# Patient Record
Sex: Male | Born: 1975 | Hispanic: Yes | Marital: Single | Smoking: Current every day smoker
Health system: Southern US, Community
[De-identification: ages and names within clinical notes are randomized; demographics above are authoritative.]

## PROBLEM LIST (undated history)

## (undated) DIAGNOSIS — R42 Dizziness and giddiness: Secondary | ICD-10-CM

## (undated) DIAGNOSIS — I10 Essential (primary) hypertension: Secondary | ICD-10-CM

---

## 2004-08-31 ENCOUNTER — Emergency Department: Payer: Self-pay | Admitting: Emergency Medicine

## 2019-07-29 ENCOUNTER — Emergency Department
Admission: EM | Admit: 2019-07-29 | Discharge: 2019-07-29 | Disposition: A | Discharge status: Home / Self Care | Payer: Self-pay | Attending: Emergency Medicine | Admitting: Emergency Medicine

## 2019-07-29 ENCOUNTER — Encounter: Payer: Self-pay | Admitting: Medical Oncology

## 2019-07-29 ENCOUNTER — Other Ambulatory Visit: Payer: Self-pay

## 2019-07-29 DIAGNOSIS — R42 Dizziness and giddiness: Secondary | ICD-10-CM | POA: Insufficient documentation

## 2019-07-29 LAB — CBC
HCT: 48.5 % (ref 39.0–52.0)
Hemoglobin: 15.4 g/dL (ref 13.0–17.0)
MCH: 26.9 pg (ref 26.0–34.0)
MCHC: 31.8 g/dL (ref 30.0–36.0)
MCV: 84.6 fL (ref 80.0–100.0)
Platelets: 301 10*3/uL (ref 150–400)
RBC: 5.73 MIL/uL (ref 4.22–5.81)
RDW: 14.5 % (ref 11.5–15.5)
WBC: 9.5 10*3/uL (ref 4.0–10.5)
nRBC: 0 % (ref 0.0–0.2)

## 2019-07-29 LAB — BASIC METABOLIC PANEL
Anion gap: 7 (ref 5–15)
BUN: 16 mg/dL (ref 6–20)
CO2: 24 mmol/L (ref 22–32)
Calcium: 8.6 mg/dL — ABNORMAL LOW (ref 8.9–10.3)
Chloride: 105 mmol/L (ref 98–111)
Creatinine, Ser: 1.09 mg/dL (ref 0.61–1.24)
GFR calc Af Amer: >60 mL/min (ref >60)
GFR calc non Af Amer: >60 mL/min (ref >60)
Glucose, Bld: 103 mg/dL — ABNORMAL HIGH (ref 70–99)
Potassium: 4 mmol/L (ref 3.5–5.1)
Sodium: 136 mmol/L (ref 135–145)

## 2019-07-29 MED ORDER — ONDANSETRON HCL 4 MG PO TABS: 4.0000 mg | ORAL_TABLET | Freq: Every day | ORAL | 1 refills | Status: DC | PRN

## 2019-07-29 MED ORDER — MECLIZINE HCL 25 MG PO TABS: 25.0000 mg | ORAL_TABLET | Freq: Three times a day (TID) | ORAL | 0 refills | Status: DC | PRN

## 2019-07-29 NOTE — ED Provider Notes (Signed)
[facility] Emergency Department Provider Note   ____________________________________________    I have reviewed the triage vital signs and the nursing notes.   HISTORY  Chief Complaint Dizziness     HPI Danny Scott is a 44 y.o. male who presents with complaints of dizziness.  Describes sensation of the room moving x2 days.  Seem to be getting better however this morning it returned.  Denies neuro deficits or headache.  Has never had this before.  No recent injuries.  No nausea vomiting.  Has not take anything for this.  Reports this is affecting his ability to work.  No history of vertigo in the past.  No tinnitus.  History reviewed. No pertinent past medical history.  There are no problems to display for this patient.   History reviewed. No pertinent surgical history.  Prior to Admission medications   Medication Sig Start Date End Date Taking? Authorizing Provider  meclizine (ANTIVERT) 25 MG tablet Take 1 tablet (25 mg total) by mouth 3 (three) times daily as needed for dizziness. 07/29/19   Jene Every, MD  ondansetron (ZOFRAN) 4 MG tablet Take 1 tablet (4 mg total) by mouth daily as needed for nausea or vomiting. 07/29/19   Jene Every, MD     Allergies Patient has no known allergies.  No family history on file.  Social History Social History   Tobacco Use  . Smoking status: Not on file  Substance Use Topics  . Alcohol use: Not on file  . Drug use: Not on file    Review of Systems  Constitutional: No fever/chills Eyes: No visual changes.  ENT: As above Cardiovascular: Denies chest pain. Respiratory: Denies shortness of breath. Gastrointestinal: No abdominal pain.  No nausea, no vomiting.   Genitourinary: Negative for dysuria. Musculoskeletal: Negative for back pain. Skin: Negative for rash. Neurological: As above   ____________________________________________   PHYSICAL EXAM:  VITAL SIGNS: ED Triage  Vitals  Enc Vitals Group     BP 07/29/19 1042 (!) 175/102     Pulse Rate 07/29/19 1042 63     Resp 07/29/19 1042 16     Temp 07/29/19 1042 98.5 F (36.9 C)     Temp Source 07/29/19 1042 Oral     SpO2 07/29/19 1042 98 %     Weight 07/29/19 1040 90.7 kg (200 lb)     Height 07/29/19 1040 1.651 m (5\' 5" )     Head Circumference --      Peak Flow --      Pain Score 07/29/19 1040 0     Pain Loc --      Pain Edu? --      Excl. in GC? --     Constitutional: Alert and oriented. No acute distress. Eyes: Conjunctivae are normal.  Left gaze nystagmus Head: Atraumatic. Ears: Normal TMs Mouth/Throat: Mucous membranes are moist.   Neck:  Painless ROM Cardiovascular: Normal rate, regular rhythm. Grossly normal heart sounds.   Respiratory: Normal respiratory effort.  No retractions. Lungs CTAB. Gastrointestinal: Soft and nontender. No distention.   Musculoskeletal: .  Warm and well perfused Neurologic:  Normal speech and language. No gross focal neurologic deficits are appreciated.  Cranial nerves II through XII are normal Skin:  Skin is warm, dry and intact. No rash noted. Psychiatric: Mood and affect are normal. Speech and behavior are normal.  ____________________________________________   LABS (all labs ordered are listed, but only abnormal results are displayed)  Labs Reviewed  BASIC METABOLIC  PANEL - Abnormal; Notable for the following components:      Result Value   Glucose, Bld 103 (*)    Calcium 8.6 (*)    All other components within normal limits  CBC   ____________________________________________  EKG  ED ECG REPORT I, Lavonia Drafts, the attending physician, personally viewed and interpreted this ECG.  Date: 07/29/2019  Rhythm: normal sinus rhythm QRS Axis: normal Intervals: normal ST/T Wave abnormalities: normal Narrative Interpretation: no evidence of acute  ischemia  ____________________________________________  RADIOLOGY  None ____________________________________________   PROCEDURES  Procedure(s) performed: No  Procedures   Critical Care performed: No ____________________________________________   INITIAL IMPRESSION / ASSESSMENT AND PLAN / ED COURSE  Pertinent labs & imaging results that were available during my care of the patient were reviewed by me and considered in my medical decision making (see chart for details).  Patient presents with vertiginous symptoms x2 days.  Normal neuro exam.  Differential includes BPV, vestibular/labyrinthitis/Mnire's.  Given age, lack of neurodeficits lack of tenderness most consistent with BPV.  Will treat with meclizine, Zofran, outpatient follow-up with ENT if no improvement.    ____________________________________________   FINAL CLINICAL IMPRESSION(S) / ED DIAGNOSES  Final diagnoses:  Dizziness  Vertigo        Note:  This document was prepared using Dragon voice recognition software and may include unintentional dictation errors.   Lavonia Drafts, MD 07/29/19 [phone]

## 2019-07-29 NOTE — ED Notes (Signed)
Pt verbalizes understanding of d/c instructions, medications and follow up. Also explained to pt significant other per pt request.

## 2019-07-29 NOTE — ED Triage Notes (Signed)
Pt reports dizziness x 2 days- reports dizziness more when he turns his head. Denies nausea/vomiting. Reports poor hydration and works in the heat.

## 2021-08-02 ENCOUNTER — Inpatient Hospital Stay
Admit: 2021-08-02 | Discharge: 2021-08-02 | Disposition: A | Discharge status: Home / Self Care | Payer: Self-pay | Attending: Cardiology | Admitting: Cardiology

## 2021-08-02 ENCOUNTER — Other Ambulatory Visit: Payer: Self-pay

## 2021-08-02 ENCOUNTER — Inpatient Hospital Stay
Admission: EM | Admit: 2021-08-02 | Discharge: 2021-08-04 | DRG: 282 | Disposition: A | Discharge status: Home / Self Care | Payer: Self-pay | Attending: Internal Medicine | Admitting: Internal Medicine

## 2021-08-02 ENCOUNTER — Emergency Department: Payer: Self-pay

## 2021-08-02 ENCOUNTER — Encounter: Payer: Self-pay | Admitting: Emergency Medicine

## 2021-08-02 DIAGNOSIS — I10 Essential (primary) hypertension: Secondary | ICD-10-CM | POA: Diagnosis present

## 2021-08-02 DIAGNOSIS — I4510 Unspecified right bundle-branch block: Secondary | ICD-10-CM | POA: Diagnosis present

## 2021-08-02 DIAGNOSIS — Z79899 Other long term (current) drug therapy: Secondary | ICD-10-CM

## 2021-08-02 DIAGNOSIS — I214 Non-ST elevation (NSTEMI) myocardial infarction: Principal | ICD-10-CM | POA: Diagnosis present

## 2021-08-02 DIAGNOSIS — Z6836 Body mass index (BMI) 36.0-36.9, adult: Secondary | ICD-10-CM

## 2021-08-02 DIAGNOSIS — E669 Obesity, unspecified: Secondary | ICD-10-CM | POA: Diagnosis present

## 2021-08-02 DIAGNOSIS — R739 Hyperglycemia, unspecified: Secondary | ICD-10-CM | POA: Diagnosis present

## 2021-08-02 DIAGNOSIS — F17210 Nicotine dependence, cigarettes, uncomplicated: Secondary | ICD-10-CM | POA: Diagnosis present

## 2021-08-02 DIAGNOSIS — I2510 Atherosclerotic heart disease of native coronary artery without angina pectoris: Secondary | ICD-10-CM | POA: Diagnosis present

## 2021-08-02 DIAGNOSIS — E785 Hyperlipidemia, unspecified: Secondary | ICD-10-CM | POA: Diagnosis present

## 2021-08-02 HISTORY — DX: Dizziness and giddiness: R42

## 2021-08-02 HISTORY — DX: Essential (primary) hypertension: I10

## 2021-08-02 LAB — HEPARIN LEVEL (UNFRACTIONATED)
Heparin Unfractionated: 0.1 IU/mL — ABNORMAL LOW (ref 0.30–0.70)
Heparin Unfractionated: 0.25 IU/mL — ABNORMAL LOW (ref 0.30–0.70)

## 2021-08-02 LAB — APTT: aPTT: 29 seconds (ref 24–36)

## 2021-08-02 LAB — BASIC METABOLIC PANEL
Anion gap: 8 (ref 5–15)
BUN: 18 mg/dL (ref 6–20)
CO2: 22 mmol/L (ref 22–32)
Calcium: 9.1 mg/dL (ref 8.9–10.3)
Chloride: 107 mmol/L (ref 98–111)
Creatinine, Ser: 1.02 mg/dL (ref 0.61–1.24)
GFR, Estimated: >60 mL/min (ref >60)
Glucose, Bld: 137 mg/dL — ABNORMAL HIGH (ref 70–99)
Potassium: 4.1 mmol/L (ref 3.5–5.1)
Sodium: 137 mmol/L (ref 135–145)

## 2021-08-02 LAB — CBC
HCT: 49 % (ref 39.0–52.0)
Hemoglobin: 15.8 g/dL (ref 13.0–17.0)
MCH: 26.6 pg (ref 26.0–34.0)
MCHC: 32.2 g/dL (ref 30.0–36.0)
MCV: 82.6 fL (ref 80.0–100.0)
Platelets: 257 10*3/uL (ref 150–400)
RBC: 5.93 MIL/uL — ABNORMAL HIGH (ref 4.22–5.81)
RDW: 14.3 % (ref 11.5–15.5)
WBC: 14.3 10*3/uL — ABNORMAL HIGH (ref 4.0–10.5)
nRBC: 0 % (ref 0.0–0.2)

## 2021-08-02 LAB — TROPONIN I (HIGH SENSITIVITY)
Troponin I (High Sensitivity): 21575 ng/L (ref <18)
Troponin I (High Sensitivity): 19560 ng/L (ref <18)
Troponin I (High Sensitivity): 19634 ng/L (ref <18)

## 2021-08-02 LAB — HIV ANTIBODY (ROUTINE TESTING W REFLEX): HIV Screen 4th Generation wRfx: NONREACTIVE

## 2021-08-02 LAB — PROTIME-INR
INR: 0.9 (ref 0.8–1.2)
Prothrombin Time: 12.4 seconds (ref 11.4–15.2)

## 2021-08-02 MED ORDER — ASPIRIN EC 81 MG PO TBEC: 81.0000 mg | DELAYED_RELEASE_TABLET | Freq: Every day | ORAL | Status: DC

## 2021-08-02 MED ORDER — SODIUM CHLORIDE 0.9 % WEIGHT BASED INFUSION
1.0000 mL/kg/h | INTRAVENOUS | Status: DC
  Administered 2021-08-03: 1 mL/kg/h via INTRAVENOUS

## 2021-08-02 MED ORDER — SODIUM CHLORIDE 0.9% FLUSH: 3.0000 mL | INTRAVENOUS | Status: DC | PRN

## 2021-08-02 MED ORDER — HEPARIN BOLUS VIA INFUSION
2050.0000 [IU] | Freq: Once | INTRAVENOUS | Status: AC
  Administered 2021-08-02: 2050 [IU] via INTRAVENOUS
  Filled 2021-08-02: qty 2050

## 2021-08-02 MED ORDER — ACETAMINOPHEN 325 MG PO TABS: 650.0000 mg | ORAL_TABLET | ORAL | Status: DC | PRN

## 2021-08-02 MED ORDER — ATORVASTATIN CALCIUM 20 MG PO TABS
40.0000 mg | ORAL_TABLET | Freq: Every evening | ORAL | Status: DC
  Administered 2021-08-02 – 2021-08-03 (×2): 40 mg via ORAL
  Filled 2021-08-02 (×2): qty 2

## 2021-08-02 MED ORDER — HEPARIN BOLUS VIA INFUSION
1000.0000 [IU] | Freq: Once | INTRAVENOUS | Status: AC
  Administered 2021-08-03: 1000 [IU] via INTRAVENOUS
  Filled 2021-08-02: qty 1000

## 2021-08-02 MED ORDER — HEPARIN (PORCINE) 25000 UT/250ML-% IV SOLN
1400.0000 [IU]/h | INTRAVENOUS | Status: DC
  Administered 2021-08-02: 850 [IU]/h via INTRAVENOUS
  Administered 2021-08-03: 1250 [IU]/h via INTRAVENOUS
  Filled 2021-08-02 (×2): qty 250

## 2021-08-02 MED ORDER — NITROGLYCERIN 0.4 MG SL SUBL: 0.4000 mg | SUBLINGUAL_TABLET | SUBLINGUAL | Status: DC | PRN

## 2021-08-02 MED ORDER — HEPARIN BOLUS VIA INFUSION
4000.0000 [IU] | Freq: Once | INTRAVENOUS | Status: AC
  Administered 2021-08-02: 4000 [IU] via INTRAVENOUS
  Filled 2021-08-02: qty 4000

## 2021-08-02 MED ORDER — SODIUM CHLORIDE 0.9 % WEIGHT BASED INFUSION: 3.0000 mL/kg/h | INTRAVENOUS | Status: AC

## 2021-08-02 MED ORDER — ONDANSETRON HCL 4 MG/2ML IJ SOLN: 4.0000 mg | Freq: Four times a day (QID) | INTRAMUSCULAR | Status: DC | PRN

## 2021-08-02 MED ORDER — ASPIRIN 81 MG PO CHEW
81.0000 mg | CHEWABLE_TABLET | ORAL | Status: AC
  Administered 2021-08-03: 81 mg via ORAL
  Filled 2021-08-02: qty 1

## 2021-08-02 MED ORDER — IOHEXOL 350 MG/ML SOLN
100.0000 mL | Freq: Once | INTRAVENOUS | Status: AC | PRN
  Administered 2021-08-02: 100 mL via INTRAVENOUS
  Filled 2021-08-02: qty 100

## 2021-08-02 MED ORDER — ASPIRIN 81 MG PO CHEW
324.0000 mg | CHEWABLE_TABLET | Freq: Once | ORAL | Status: AC
  Administered 2021-08-02: 324 mg via ORAL
  Filled 2021-08-02: qty 4

## 2021-08-02 MED ORDER — SODIUM CHLORIDE 0.9 % IV SOLN: 250.0000 mL | INTRAVENOUS | Status: DC | PRN

## 2021-08-02 MED ORDER — METOPROLOL SUCCINATE ER 25 MG PO TB24
12.5000 mg | ORAL_TABLET | Freq: Every day | ORAL | Status: DC
  Filled 2021-08-02: qty 0.5

## 2021-08-02 MED ORDER — KETOROLAC TROMETHAMINE 30 MG/ML IJ SOLN
30.0000 mg | Freq: Once | INTRAMUSCULAR | Status: DC
  Filled 2021-08-02: qty 1

## 2021-08-02 MED ORDER — METOPROLOL SUCCINATE ER 25 MG PO TB24
25.0000 mg | ORAL_TABLET | Freq: Every day | ORAL | Status: DC
  Administered 2021-08-02 – 2021-08-04 (×3): 25 mg via ORAL
  Filled 2021-08-02 (×3): qty 1

## 2021-08-02 MED ORDER — SODIUM CHLORIDE 0.9% FLUSH
3.0000 mL | Freq: Two times a day (BID) | INTRAVENOUS | Status: DC
  Administered 2021-08-02 – 2021-08-03 (×3): 3 mL via INTRAVENOUS

## 2021-08-02 NOTE — Plan of Care (Signed)

## 2021-08-02 NOTE — Consult Note (Signed)
?[place] [facility] CONSULT NOTE  ? ?    ?Patient ID: ?Danny Scott ?MRN: [id number] ?DOB/AGE: 01/02/1976 46 y.o. ? ?Admit date: 08/02/2021 ?Referring Physician Dr. Lavonia Drafts  ?Primary Physician none ?Primary Cardiologist none ?Reason for Consultation chest pain ? ?HPI: Danny Scott is a 46 year old male with a past medical history of vertigo and ongoing tobacco use who presented to [facility] ED 08/02/2021 with chest pain x4 days and [place] 78[address].  Cardiology is consulted for further assistance. ? ?He presents today with his wife who contributes to the history.  He has been having left-sided chest pressure that radiates to his left arm that has been fairly constant at a 6/10 over the past 4 days that worsened in intensity overnight which prompted his presentation.  His wife says he took a couple ibuprofen the other day that eased off the pain and took 2 baby aspirin.  He cannot get comfortable lying down or sitting down so he has been walking a lot that helps ease the discomfort slightly.  He denies associated shortness of breath, diaphoresis, nausea, vomiting.  Denies this ever happening in the past.  Currently he rates his pain a 3/10 and is sitting comfortably in no distress. ? ?Reports a 20-pack-year history of smoking, currently cut back to 2 to 3 cigarettes/day.  Reports taking no prescription medications, no drug allergies.  Works in [employer].  Denies family history of heart problems. ? ?Vitals notable for a blood pressure of 144/104, heart rate 82 SPO2 98% on room air.  Labs are notable for initial troponin of 21,575, WBCs 14, creatinine 1.02, EGFR greater than 60.  Chest x-ray without acute cardiopulmonary disease ? ?Review of systems complete and found to be negative unless listed above  ? ? ? ?Past Medical History:  ?Diagnosis Date  ? Hypertension   ? Vertigo   ?  ?History reviewed. No pertinent surgical history.  ?(Not in a hospital admission) ? ?Social  History  ? ?Socioeconomic History  ? Marital status: Single  ?  Spouse name: Not on file  ? Number of children: Not on file  ? Years of education: Not on file  ? Highest education level: Not on file  ?Occupational History  ? Not on file  ?Tobacco Use  ? Smoking status: Every Day  ?  Packs/day: 0.50  ?  Types: Cigarettes  ? Smokeless tobacco: Never  ?Vaping Use  ? Vaping Use: Never used  ?Substance and Sexual Activity  ? Alcohol use: Not on file  ? Drug use: Not on file  ? Sexual activity: Not on file  ?Other Topics Concern  ? Not on file  ?Social History Narrative  ? Not on file  ? ?Social Determinants of Health  ? ?Financial Resource Strain: Not on file  ?Food Insecurity: Not on file  ?Transportation Needs: Not on file  ?Physical Activity: Not on file  ?Stress: Not on file  ?Social Connections: Not on file  ?Intimate Partner Violence: Not on file  ?  ?History reviewed. No pertinent family history.  ? ? ?Review of systems complete and found to be negative unless listed above  ? ? ?PHYSICAL EXAM ?General: Pleasant middle-aged Hispanic male, well nourished, in no acute distress.  Sitting with his legs on the side of ED stretcher, wife at bedside ?HEENT:  Normocephalic and atraumatic. ?Neck:  No JVD.  ?Lungs: Normal respiratory effort on room air. Clear bilaterally to auscultation. No wheezes, crackles, rhonchi.  ?Heart: HRRR . Normal S1 and  S2 without gallops or murmurs. Radial & DP pulses 2+ bilaterally. ?Abdomen: Obese appearing.  ?Msk: Normal strength and tone for age. ?Extremities: Warm and well perfused. No clubbing, cyanosis.  No peripheral edema.  ?Neuro: Alert and oriented X 3. ?Psych:  Answers questions appropriately.  ? ?Labs: ?  ?Lab Results  ?Component Value Date  ? WBC 14.3 (H) 08/02/2021  ? HGB 15.8 08/02/2021  ? HCT 49.0 08/02/2021  ? MCV 82.6 08/02/2021  ? PLT 257 08/02/2021  ?  ?Recent Labs  ?Lab 08/02/21 ?0726  ?NA 137  ?K 4.1  ?CL 107  ?CO2 22  ?BUN 18  ?CREATININE 1.02  ?CALCIUM 9.1  ?GLUCOSE 137*   ? ?No results found for: CKTOTAL, CKMB, CKMBINDEX, TROPONINI No results found for: CHOL ?No results found for: HDL ?No results found for: Antimony ?No results found for: TRIG ?No results found for: CHOLHDL ?No results found for: LDLDIRECT  ?  ?Radiology: DG Chest 2 View ? ?Result Date: 08/02/2021 ?CLINICAL DATA:  Pt states he has had chest pain since last Thursday, pain radiates from left side of chest through left arm, no hx heart or lung conditions EXAM: CHEST - 2 VIEW COMPARISON:  none FINDINGS: Lungs are clear. Heart size and mediastinal contours are within normal limits. No effusion. Visualized bones unremarkable. IMPRESSION: No acute cardiopulmonary disease. Electronically Signed   By: Lucrezia Europe M.D.   On: 08/02/2021 07:55   ? ?ECHO no prior ? ?TELEMETRY reviewed by me: sinus rhythm, 80 ? ?EKG reviewed by me: Sinus rhythm, incomplete RBBB nonspecific T wave abnormality laterally ? ?ASSESSMENT AND PLAN:  ?Danny Scott is a 46 year old male with a past medical history of vertigo and ongoing tobacco use who presented to [facility] ED 08/02/2021 with chest pain x4 days and [place] troponin of 413[address].  Cardiology is consulted for further assistance. ? ?#NSTEMI ?Patient presents with ongoing left sided chest pressure that radiates down his left arm x4 days that acutely worsened overnight.  He rates at worst a 6 out of 10 aggravated by sitting or lying down (cannot get comfortable) and somewhat better with ambulation and with ibuprofen.  Initial troponin is elevated at [address], EKG looks overall similar to prior from 2021 with some nonspecific T wave abnormalities laterally, but overall presentation concerning for NSTEMI ?-Agree with current therapy and work-up ongoing ?-trend troponins until peak ?-S/p 325 mg aspirin, continue 81 mg aspirin daily ?-Recommend heparin for 48 hours ?-Initiate low-dose beta-blocker with metoprolol XL 12.5, will uptitrate as tolerated ?-Repeat EKG with further chest discomfort ?-SL  nitroglycerin as needed ?-Plan for cardiac catheterization with Dr. Clayborn Bigness likely tomorrow morning or sooner based on clinical course.  Risks and benefits discussed with patient and his wife and he is in agreement to proceed tomorrow. Will make NPO at midnight ? ?This patient's plan of care was discussed and created with Dr. Lujean Amel and he is in agreement. ? ?Signed: ?Tristan Schroeder , PA-C ?08/02/2021, 8:59 AM ?[facility] ? ? ? ?  ?

## 2021-08-02 NOTE — Assessment & Plan Note (Signed)
Patient with a history of hypertension and nicotine dependence who presents to the ER for evaluation of a 4-day history of left sided chest pain with radiation to his left arm and noted to have significantly elevated troponin levels. ?Twelve-lead EKG does not show any acute ST-T wave elevation ?We will cycle cardiac enzymes ?Place patient on aspirin 81 mg daily, beta-blockers as well as atorvastatin ?Continue heparin drip initiated in the ER ?Obtain 2D echocardiogram to assess LVEF and rule out regional wall motion abnormality ?We will request cardiology consult ?

## 2021-08-02 NOTE — Assessment & Plan Note (Signed)
Continue metoprolol and uptitrate to optimize blood pressure control. ?

## 2021-08-02 NOTE — ED Notes (Signed)
See triage note  presents with pain to mid chest for the past 4 days  pain has eased off with IBU   denies any fever or cough  states last pm the discomfort went into left arm  ?

## 2021-08-02 NOTE — Consult Note (Signed)
ANTICOAGULATION CONSULT NOTE - Initial Consult ? ?Pharmacy Consult for Heparin ?Indication: chest pain/ACS ? ?No Known Allergies ? ?Patient Measurements: ?Height: 5' (152.4 cm) ?Weight: 83.9 kg (185 lb) ?IBW/kg (Calculated) : 50 ?Heparin Dosing Weight: 68.9 kg ? ?Vital Signs: ?Temp: 98.7 ?F (37.1 ?C) (04/17 1128) ?Temp Source: Oral (04/17 1128) ?BP: 153/110 (04/17 1128) ?Pulse Rate: 83 (04/17 1128) ? ?Labs: ?Recent Labs  ?  08/02/21 ?0726 08/02/21 ?0827 08/02/21 ?1134 08/02/21 ?1246 08/02/21 ?[id number]  ?HGB 15.8  --   --   --   --   ?HCT 49.0  --   --   --   --   ?PLT 257  --   --   --   --   ?APTT  --  29  --   --   --   ?LABPROT  --  12.4  --   --   --   ?INR  --  0.9  --   --   --   ?HEPARINUNFRC  --   --   --   --  0.10*  ?CREATININE 1.02  --   --   --   --   ?TROPONINIHS 21,575*  --  [id number]*  --   ? ? ? ?Estimated Creatinine Clearance: 81.4 mL/min (by C-G formula based on SCr of 1.02 mg/dL). ? ? ?Medical History: ?Past Medical History:  ?Diagnosis Date  ? Hypertension   ? Vertigo   ? ? ?Medications:  ?No history of chronic anticoagulation PTA  ? ?Assessment: ?Pharmacy has been consulted to initiate heparin on 45yo patient presenting to the ED with chest discomfort. Initial troponin level of 21,575. Cardiology team has been consulted. Baseline labs: INR 0.9, aPTT 29 sec, Hgb 15.8, Plts 257. ? ?Goal of Therapy:  ?Heparin level 0.3-0.7 units/ml ?Monitor platelets by anticoagulation protocol: Yes ?  ?Plan:  ?4/17 1618 HL 0.10, Subtherapeutic  ?Give 2050 units bolus x 1 ?Increase heparin infusion to 1100 units/hr ?Check anti-Xa level in 6 hours following rate change ?Continue to monitor H&H and platelets ? ?Dorothe Pea, PharmD, BCPS ?Clinical Pharmacist   ?08/02/2021,4:38 PM ? ? ?

## 2021-08-02 NOTE — Consult Note (Signed)
ANTICOAGULATION CONSULT NOTE - Initial Consult ? ?Pharmacy Consult for Heparin ?Indication: chest pain/ACS ? ?No Known Allergies ? ?Patient Measurements: ?Height: 5' (152.4 cm) ?Weight: 83.9 kg (185 lb) ?IBW/kg (Calculated) : 50 ?Heparin Dosing Weight: 68.9 kg ? ?Vital Signs: ?Temp: 99.3 ?F (37.4 ?C) (04/17 0725) ?Temp Source: Oral (04/17 0725) ?BP: 144/104 (04/17 0806) ?Pulse Rate: 82 (04/17 0806) ? ?Labs: ?Recent Labs  ?  08/02/21 ?0726 08/02/21 ?0827  ?HGB 15.8  --   ?HCT 49.0  --   ?PLT 257  --   ?APTT  --  29  ?LABPROT  --  12.4  ?INR  --  0.9  ?CREATININE 1.02  --   ?TROPONINIHS 21,575*  --   ? ? ?Estimated Creatinine Clearance: 81.4 mL/min (by C-G formula based on SCr of 1.02 mg/dL). ? ? ?Medical History: ?Past Medical History:  ?Diagnosis Date  ? Hypertension   ? Vertigo   ? ? ?Medications:  ?No history of chronic anticoagulation PTA  ? ?Assessment: ?Pharmacy has been consulted to initiate heparin on 45yo patient presenting to the ED with chest discomfort. Initial troponin level of 21,575. Cardiology team has been consulted. Baseline labs: INR 0.9, aPTT 29 sec, Hgb 15.8, Plts 257. ? ?Goal of Therapy:  ?Heparin level 0.3-0.7 units/ml ?Monitor platelets by anticoagulation protocol: Yes ?  ?Plan:  ?Give 4000 units bolus x 1 ?Start heparin infusion at 850 units/hr ?Check anti-Xa level in 6 hours and daily while on heparin ?Continue to monitor H&H and platelets ? ?Mac Dowdell A Jihan Mellette ?08/02/2021,9:58 AM ? ? ?

## 2021-08-02 NOTE — ED Provider Notes (Signed)
? ?[facility] ?Provider Note ? ? ? Event Date/Time  ? First MD Initiated Contact with Patient 08/02/21 [id number]   ?  (approximate) ? ? ?History  ? ?Chest Pain ? ? ?HPI ? ?Danny Scott is a 46 y.o. male with a history of smoking, high blood pressure not on medications who presents with complaints of chest discomfort.  Describes central chest discomfort over the last 4 days, seems to improve with ibuprofen, last night had some radiation to his left arm, improved now.  No shortness of breath, no significant cough, no fevers.  No calf pain or swelling. ?  ? ? ?Physical Exam  ? ?Triage Vital Signs: ?ED Triage Vitals  ?Enc Vitals Group  ?   BP 08/02/21 0726 (!) 143/99  ?   Pulse Rate 08/02/21 0725 88  ?   Resp 08/02/21 0725 20  ?   Temp 08/02/21 0725 99.3 ?F (37.4 ?C)  ?   Temp Source 08/02/21 0725 Oral  ?   SpO2 08/02/21 0725 98 %  ?   Weight 08/02/21 0723 83.9 kg (185 lb)  ?   Height 08/02/21 0723 1.524 m (5')  ?   Head Circumference --   ?   Peak Flow --   ?   Pain Score 08/02/21 0723 6  ?   Pain Loc --   ?   Pain Edu? --   ?   Excl. in GC? --   ? ? ?Most recent vital signs: ?Vitals:  ? 08/02/21 0726 08/02/21 0806  ?BP: (!) 143/99 (!) 144/104  ?Pulse:  82  ?Resp:  20  ?Temp:    ?SpO2:  98%  ? ? ? ?General: Awake, no distress.  ?CV:  Good peripheral perfusion.  Regular rate and rhythm, ?Resp:  Normal effort.  CTA bilaterally ?Abd:  No distention.  ?Other:  No calf swelling or pain ? ? ?ED Results / Procedures / Treatments  ? ?Labs ?(all labs ordered are listed, but only abnormal results are displayed) ?Labs Reviewed  ?BASIC METABOLIC PANEL - Abnormal; Notable for the following components:  ?    Result Value  ? Glucose, Bld 137 (*)   ? All other components within normal limits  ?CBC - Abnormal; Notable for the following components:  ? WBC 14.3 (*)   ? RBC 5.93 (*)   ? All other components within normal limits  ?TROPONIN I (HIGH SENSITIVITY) - Abnormal; Notable for the following components:   ? Troponin I (High Sensitivity) 21,575 (*)   ? All other components within normal limits  ?APTT  ?PROTIME-INR  ?TROPONIN I (HIGH SENSITIVITY)  ? ? ? ?EKG ? ?ED ECG REPORT ?I, Jene Every, the attending physician, personally viewed and interpreted this ECG. ? ?Date: 08/02/2021 ? ?Rhythm: normal sinus rhythm ?QRS Axis: normal ?Intervals: Incomplete right bundle ?ST/T Wave abnormalities: Nonspecific changes ?Narrative Interpretation: no evidence of acute ischemia ? ? ? ?RADIOLOGY ?Chest x-ray viewed interpret by me, no acute abnormality ? ? ? ?PROCEDURES: ? ?Critical Care performed: yes ? ?CRITICAL CARE ?Performed by: Jene Every ? ? ?Total critical care time: 30 minutes ? ?Critical care time was exclusive of separately billable procedures and treating other patients. ? ?Critical care was necessary to treat or prevent imminent or life-threatening deterioration. ? ?Critical care was time spent personally by me on the following activities: development of treatment plan with patient and/or surrogate as well as nursing, discussions with consultants, evaluation of patient's response to treatment, examination of patient, obtaining history  from patient or surrogate, ordering and performing treatments and interventions, ordering and review of laboratory studies, ordering and review of radiographic studies, pulse oximetry and re-evaluation of patient's condition. ? ? ?Procedures ? ? ?MEDICATIONS ORDERED IN ED: ?Medications  ?aspirin chewable tablet 324 mg (324 mg Oral Given 08/02/21 0829)  ?iohexol (OMNIPAQUE) 350 MG/ML injection 100 mL (100 mLs Intravenous Contrast Given 08/02/21 0921)  ? ? ? ?IMPRESSION / MDM / ASSESSMENT AND PLAN / ED COURSE  ?I reviewed the triage vital signs and the nursing notes. ? ?Patient presents with chest pain as detailed above, reports improvement with ibuprofen, 4 days of constant discomfort.  EKG not significantly changed from prior, not consistent with STEMI ? ?Differential includes  musculoskeletal chest pain, less likely ACS, not consistent with dissection or PE. ? ?Pending high-sensitivity troponin, labs ? ?Chest x-ray is reassuring. ?----------------------------------------- ?8:13 AM on 08/02/2021 ?----------------------------------------- ? ?Notified of critically elevated troponin of 21,000, I will discuss with Dr. Juliann Pares of cardiology.  Sent for CTA to rule out dissection ? ? ?----------------------------------------- ?9:46 AM on 08/02/2021 ?----------------------------------------- ?CT angiography reviewed by me, no acute evidence of dissection, confirmed by radiology.  Appreciate Dr. Glennis Brink consultation, heparin GTT ordered ? ?We will admit to the medicine service ? ? ? ?  ? ? ?FINAL CLINICAL IMPRESSION(S) / ED DIAGNOSES  ? ?Final diagnoses:  ?NSTEMI (non-ST elevated myocardial infarction) (HCC)  ? ? ? ?Rx / DC Orders  ? ?ED Discharge Orders   ? ? None  ? ?  ? ? ? ?Note:  This document was prepared using Dragon voice recognition software and may include unintentional dictation errors. ?  ?Jene Every, MD ?08/02/21 [phone] ? ?

## 2021-08-02 NOTE — ED Triage Notes (Signed)
Pt via POV from home. Pt c/o constant L sided CP that started 4 days ago. States it radiates down his L arm. Denies SOB. Denies cardiac hx. Pt is A&OX4 and NAD ?

## 2021-08-02 NOTE — H&P (Signed)
?History and Physical  ? ? ?Patient: Danny Scott [id number] DOB: 06/30/1975 ?DOA: 08/02/2021 ?DOS: the patient was seen and examined on 08/02/2021 ?PCP: Pcp, No  ?Patient coming from: Home ? ?Chief Complaint:  ?Chief Complaint  ?Patient presents with  ? Chest Pain  ? ?HPI: Danny Scott is a 46 y.o. male with medical history significant for hypertension, nicotine dependence and vertigo who presents to the ER for evaluation of left sided chest pain that started 4 days prior to his admission.  He rates his pain a 7 x 10 in intensity at its worst and states that the pain radiated to his left arm.  He denies having any nausea, no vomiting, no shortness of breath, no diaphoresis, no palpitations, no fever, no chills, no cough, no leg swelling, no dizziness or lightheadedness, no calf pain, no blurred vision no focal deficit. ?Due to the persistence of his symptoms he presented to the ER for further evaluation. ?Labs revealed markedly elevated troponin levels and twelve-lead EKG shows normal sinus rhythm with T wave abnormality in the lateral leads and incomplete right bundle branch block. ?He does not have a personal or family history of coronary artery disease. ? ?Review of Systems: As mentioned in the history of present illness. All other systems reviewed and are negative. ?Past Medical History:  ?Diagnosis Date  ? Hypertension   ? Vertigo   ? ?History reviewed. No pertinent surgical history. ?Social History:  reports that he has been smoking cigarettes. He has been smoking an average of .5 packs per day. He has never used smokeless tobacco. No history on file for alcohol use and drug use. ? ?No Known Allergies ? ?History reviewed. No pertinent family history. ? ?Prior to Admission medications   ?Not on File  ? ? ?Physical Exam: ?Vitals:  ? 08/02/21 0726 08/02/21 0806 08/02/21 1048 08/02/21 1128  ?BP: (!) 143/99 (!) 144/104 (!) 140/98 (!) 153/110  ?Pulse:  82 80 83  ?Resp:  20 20 18   ?Temp:    98.7 ?F (37.1 ?C) 98.7 ?F (37.1 ?C)  ?TempSrc:   Oral Oral  ?SpO2:  98% 98% 100%  ?Weight:      ?Height:      ? ?Physical Exam ?Vitals and nursing note reviewed.  ?Constitutional:   ?   Appearance: He is well-developed.  ?HENT:  ?   Head: Normocephalic and atraumatic.  ?Eyes:  ?   Pupils: Pupils are equal, round, and reactive to light.  ?Cardiovascular:  ?   Rate and Rhythm: Normal rate and regular rhythm.  ?   Heart sounds: Normal heart sounds.  ?Pulmonary:  ?   Effort: Pulmonary effort is normal.  ?   Breath sounds: Normal breath sounds.  ?Abdominal:  ?   General: Bowel sounds are normal.  ?   Palpations: Abdomen is soft.  ?Musculoskeletal:     ?   General: Normal range of motion.  ?   Cervical back: Normal range of motion and neck supple.  ?Skin: ?   General: Skin is warm and dry.  ?Neurological:  ?   General: No focal deficit present.  ?   Mental Status: He is alert.  ?Psychiatric:     ?   Mood and Affect: Mood normal.     ?   Behavior: Behavior normal.  ? ? ?Data Reviewed: ?Relevant notes from primary care and specialist visits, past discharge summaries as available in EHR, including Care Everywhere. ?Prior diagnostic testing as pertinent to current admission diagnoses ?Updated  medications and problem lists for reconciliation ?ED course, including vitals, labs, imaging, treatment and response to treatment ?Triage notes, nursing and pharmacy notes and ED provider's notes ?Notable results as noted in HPI ?Labs reviewed.  White count 14.3, hemoglobin 15.8, hematocrit 49, RDW 14.3, platelet count 257, troponin 21,575, sodium 137, potassium 4.1, chloride 107, glucose 137, BUN 18, creatinine 1.02 ?Chest x-ray reviewed by me shows no evidence of acute cardiopulmonary disease ?CT angio chest and aorta with and without contrast shows no evidence of aortic dissection or other acute abnormality. ?Twelve-lead EKG reviewed by me shows normal sinus rhythm with an incomplete right bundle branch block, T wave abnormality in  the lateral leads ?There are no new results to review at this time. ? ?Assessment and Plan: ?* NSTEMI (non-ST elevated myocardial infarction) (HCC) ?Patient with a history of hypertension and nicotine dependence who presents to the ER for evaluation of a 4-day history of left sided chest pain with radiation to his left arm and noted to have significantly elevated troponin levels. ?Twelve-lead EKG does not show any acute ST-T wave elevation ?We will cycle cardiac enzymes ?Place patient on aspirin 81 mg daily, beta-blockers as well as atorvastatin ?Continue heparin drip initiated in the ER ?Obtain 2D echocardiogram to assess LVEF and rule out regional wall motion abnormality ?We will request cardiology consult ? ?Hypertension ?Continue metoprolol and uptitrate to optimize blood pressure control. ? ? ? ? ? Advance Care Planning:   Code Status: Full Code  ? ?Consults: Cardiology ? ?Family Communication: Greater than 50% of time was spent discussing patient's condition and plan of care with him and his wife at the bedside.  All questions and concerns have been addressed.  They verbalized understanding and agree with the plan.   ? ?Severity of Illness: ?The appropriate patient status for this patient is INPATIENT. Inpatient status is judged to be reasonable and necessary in order to provide the required intensity of service to ensure the patient's safety. The patient's presenting symptoms, physical exam findings, and initial radiographic and laboratory data in the context of their chronic comorbidities is felt to place them at high risk for further clinical deterioration. Furthermore, it is not anticipated that the patient will be medically stable for discharge from the hospital within 2 midnights of admission.  ? ?* I certify that at the point of admission it is my clinical judgment that the patient will require inpatient hospital care spanning beyond 2 midnights from the point of admission due to high intensity of  service, high risk for further deterioration and high frequency of surveillance required.* ? ?Author: ?Lucile Shutters, MD ?08/02/2021 12:03 PM ? ?For on call review [email or web address].  ?

## 2021-08-02 NOTE — Consult Note (Addendum)
ANTICOAGULATION CONSULT NOTE  ? ?Pharmacy Consult for Heparin ?Indication: chest pain/ACS ? ?No Known Allergies ? ?Patient Measurements: ?Height: 5' (152.4 cm) ?Weight: 83.9 kg (185 lb) ?IBW/kg (Calculated) : 50 ?Heparin Dosing Weight: 68.9 kg ? ?Vital Signs: ?Temp: 98.1 ?F (36.7 ?C) (04/17 2141) ?Temp Source: Oral (04/17 2141) ?BP: 120/90 (04/17 2141) ?Pulse Rate: 92 (04/17 2141) ? ?Labs: ?Recent Labs  ?  08/02/21 ?0726 08/02/21 ?0827 08/02/21 ?1134 08/02/21 ?1246 08/02/21 ?1618 08/02/21 ?2253  ?HGB 15.8  --   --   --   --   --   ?HCT 49.0  --   --   --   --   --   ?PLT 257  --   --   --   --   --   ?APTT  --  29  --   --   --   --   ?LABPROT  --  12.4  --   --   --   --   ?INR  --  0.9  --   --   --   --   ?HEPARINUNFRC  --   --   --   --  0.10* 0.25*  ?CREATININE 1.02  --   --   --   --   --   ?TROPONINIHS 21,575*  --  19,[id number]*  --   --   ? ? ? ?Estimated Creatinine Clearance: 81.4 mL/min (by C-G formula based on SCr of 1.02 mg/dL). ? ? ?Medical History: ?Past Medical History:  ?Diagnosis Date  ? Hypertension   ? Vertigo   ? ? ?Medications:  ?No history of chronic anticoagulation PTA  ? ?Assessment: ?Pharmacy has been consulted to initiate heparin on 46yo patient presenting to the ED with chest discomfort. Initial troponin level of 21,575. Cardiology team has been consulted. Baseline labs: INR 0.9, aPTT 29 sec, Hgb 15.8, Plts 257. ? ?Goal of Therapy:  ?Heparin level 0.3-0.7 units/ml ?Monitor platelets by anticoagulation protocol: Yes ?  ?Plan:  ?Give 1000 units bolus x 1 ?Increase heparin infusion to 1250 units/hr ?Recheck HL in ~6 hr and daily while on heparin ?Continue to monitor H&H and platelets ? ?Renda Rolls, PharmD, MBA ?08/02/2021 ?11:48 PM ? ? ? ?

## 2021-08-03 ENCOUNTER — Encounter: Payer: Self-pay | Admitting: Internal Medicine

## 2021-08-03 ENCOUNTER — Encounter
Admission: EM | Disposition: A | Discharge status: Home / Self Care | Payer: Self-pay | Source: Home / Self Care | Attending: Internal Medicine

## 2021-08-03 ENCOUNTER — Other Ambulatory Visit: Payer: Self-pay

## 2021-08-03 DIAGNOSIS — I10 Essential (primary) hypertension: Secondary | ICD-10-CM

## 2021-08-03 HISTORY — PX: LEFT HEART CATH AND CORONARY ANGIOGRAPHY: CATH118249

## 2021-08-03 HISTORY — PX: CORONARY STENT INTERVENTION: CATH118234

## 2021-08-03 LAB — LIPID PANEL
Cholesterol: 199 mg/dL (ref 0–200)
HDL: 39 mg/dL — ABNORMAL LOW (ref >40)
LDL Cholesterol: 126 mg/dL — ABNORMAL HIGH (ref 0–99)
Total CHOL/HDL Ratio: 5.1 RATIO
Triglycerides: 172 mg/dL — ABNORMAL HIGH (ref <150)
VLDL: 34 mg/dL (ref 0–40)

## 2021-08-03 LAB — BASIC METABOLIC PANEL
Anion gap: 8 (ref 5–15)
BUN: 19 mg/dL (ref 6–20)
CO2: 24 mmol/L (ref 22–32)
Calcium: 8.7 mg/dL — ABNORMAL LOW (ref 8.9–10.3)
Chloride: 104 mmol/L (ref 98–111)
Creatinine, Ser: 1.03 mg/dL (ref 0.61–1.24)
GFR, Estimated: >60 mL/min (ref >60)
Glucose, Bld: 113 mg/dL — ABNORMAL HIGH (ref 70–99)
Potassium: 3.9 mmol/L (ref 3.5–5.1)
Sodium: 136 mmol/L (ref 135–145)

## 2021-08-03 LAB — CBC
HCT: 46.7 % (ref 39.0–52.0)
Hemoglobin: 15 g/dL (ref 13.0–17.0)
MCH: 26.5 pg (ref 26.0–34.0)
MCHC: 32.1 g/dL (ref 30.0–36.0)
MCV: 82.5 fL (ref 80.0–100.0)
Platelets: 240 10*3/uL (ref 150–400)
RBC: 5.66 MIL/uL (ref 4.22–5.81)
RDW: 14.3 % (ref 11.5–15.5)
WBC: 12.6 10*3/uL — ABNORMAL HIGH (ref 4.0–10.5)
nRBC: 0 % (ref 0.0–0.2)

## 2021-08-03 LAB — ECHOCARDIOGRAM COMPLETE
AR max vel: 2.71 cm2
AV Area VTI: 2.81 cm2
AV Area mean vel: 2.67 cm2
AV Mean grad: 2 mmHg
AV Peak grad: 4.2 mmHg
Ao pk vel: 1.03 m/s
Area-P 1/2: 3.79 cm2
Height: 60 in
MV VTI: 2.11 cm2
S' Lateral: 3.79 cm
Weight: 2960 oz

## 2021-08-03 LAB — HEPARIN LEVEL (UNFRACTIONATED): Heparin Unfractionated: 0.26 IU/mL — ABNORMAL LOW (ref 0.30–0.70)

## 2021-08-03 LAB — HEMOGLOBIN A1C
Hgb A1c MFr Bld: 5.8 % — ABNORMAL HIGH (ref 4.8–5.6)
Mean Plasma Glucose: 119.76 mg/dL

## 2021-08-03 LAB — POCT ACTIVATED CLOTTING TIME: Activated Clotting Time: 365 seconds

## 2021-08-03 SURGERY — LEFT HEART CATH AND CORONARY ANGIOGRAPHY
Anesthesia: Moderate Sedation

## 2021-08-03 MED ORDER — ASPIRIN 81 MG PO CHEW
CHEWABLE_TABLET | ORAL | Status: AC
  Filled 2021-08-03: qty 3

## 2021-08-03 MED ORDER — LABETALOL HCL 5 MG/ML IV SOLN: 10.0000 mg | INTRAVENOUS | Status: AC | PRN

## 2021-08-03 MED ORDER — FENTANYL CITRATE (PF) 100 MCG/2ML IJ SOLN
INTRAMUSCULAR | Status: AC
  Filled 2021-08-03: qty 2

## 2021-08-03 MED ORDER — HEPARIN (PORCINE) IN NACL 1000-0.9 UT/500ML-% IV SOLN
INTRAVENOUS | Status: DC | PRN
  Administered 2021-08-03: 1000 mL

## 2021-08-03 MED ORDER — HEPARIN (PORCINE) IN NACL 1000-0.9 UT/500ML-% IV SOLN
INTRAVENOUS | Status: AC
  Filled 2021-08-03: qty 1000

## 2021-08-03 MED ORDER — ACETAMINOPHEN 325 MG PO TABS: 650.0000 mg | ORAL_TABLET | ORAL | Status: DC | PRN

## 2021-08-03 MED ORDER — HEPARIN BOLUS VIA INFUSION
1000.0000 [IU] | Freq: Once | INTRAVENOUS | Status: AC
  Administered 2021-08-03: 1000 [IU] via INTRAVENOUS
  Filled 2021-08-03: qty 1000

## 2021-08-03 MED ORDER — TICAGRELOR 90 MG PO TABS
ORAL_TABLET | ORAL | Status: DC | PRN
  Administered 2021-08-03: 180 mg via ORAL

## 2021-08-03 MED ORDER — ONDANSETRON HCL 4 MG/2ML IJ SOLN: 4.0000 mg | Freq: Four times a day (QID) | INTRAMUSCULAR | Status: DC | PRN

## 2021-08-03 MED ORDER — VERAPAMIL HCL 2.5 MG/ML IV SOLN
INTRAVENOUS | Status: DC | PRN
  Administered 2021-08-03: 2.5 mg via INTRA_ARTERIAL

## 2021-08-03 MED ORDER — IOHEXOL 300 MG/ML  SOLN
INTRAMUSCULAR | Status: DC | PRN
Start: 2021-08-03 — End: 2021-08-03
  Administered 2021-08-03: 168 mL

## 2021-08-03 MED ORDER — SODIUM CHLORIDE 0.9 % WEIGHT BASED INFUSION: 1.0000 mL/kg/h | INTRAVENOUS | Status: AC

## 2021-08-03 MED ORDER — TICAGRELOR 90 MG PO TABS
ORAL_TABLET | ORAL | Status: AC
  Filled 2021-08-03: qty 2

## 2021-08-03 MED ORDER — LIDOCAINE HCL 1 % IJ SOLN
INTRAMUSCULAR | Status: AC
  Filled 2021-08-03: qty 20

## 2021-08-03 MED ORDER — MIDAZOLAM HCL 2 MG/2ML IJ SOLN
INTRAMUSCULAR | Status: AC
  Filled 2021-08-03: qty 2

## 2021-08-03 MED ORDER — SODIUM CHLORIDE 0.9% FLUSH
3.0000 mL | Freq: Two times a day (BID) | INTRAVENOUS | Status: DC
  Administered 2021-08-03: 3 mL via INTRAVENOUS

## 2021-08-03 MED ORDER — ASPIRIN 81 MG PO CHEW
CHEWABLE_TABLET | ORAL | Status: DC | PRN
  Administered 2021-08-03: 243 mg via ORAL

## 2021-08-03 MED ORDER — MIDAZOLAM HCL 2 MG/2ML IJ SOLN
INTRAMUSCULAR | Status: DC | PRN
  Administered 2021-08-03: 1 mg via INTRAVENOUS

## 2021-08-03 MED ORDER — LIDOCAINE HCL (PF) 1 % IJ SOLN
INTRAMUSCULAR | Status: DC | PRN
  Administered 2021-08-03: 2 mL

## 2021-08-03 MED ORDER — ASPIRIN EC 325 MG PO TBEC
325.0000 mg | DELAYED_RELEASE_TABLET | Freq: Every day | ORAL | Status: DC
  Administered 2021-08-04: 325 mg via ORAL
  Filled 2021-08-03: qty 1

## 2021-08-03 MED ORDER — FENTANYL CITRATE (PF) 100 MCG/2ML IJ SOLN
INTRAMUSCULAR | Status: DC | PRN
Start: 2021-08-03 — End: 2021-08-03
  Administered 2021-08-03: 25 ug via INTRAVENOUS

## 2021-08-03 MED ORDER — VERAPAMIL HCL 2.5 MG/ML IV SOLN
INTRAVENOUS | Status: AC
  Filled 2021-08-03: qty 2

## 2021-08-03 MED ORDER — HYDRALAZINE HCL 20 MG/ML IJ SOLN: 10.0000 mg | INTRAMUSCULAR | Status: AC | PRN

## 2021-08-03 MED ORDER — HEPARIN SODIUM (PORCINE) 1000 UNIT/ML IJ SOLN
INTRAMUSCULAR | Status: DC | PRN
  Administered 2021-08-03 (×2): 4000 [IU] via INTRAVENOUS

## 2021-08-03 MED ORDER — CLOPIDOGREL BISULFATE 75 MG PO TABS
75.0000 mg | ORAL_TABLET | Freq: Every day | ORAL | Status: DC
  Administered 2021-08-04: 75 mg via ORAL
  Filled 2021-08-03: qty 1

## 2021-08-03 MED ORDER — SODIUM CHLORIDE 0.9% FLUSH: 3.0000 mL | INTRAVENOUS | Status: DC | PRN

## 2021-08-03 MED ORDER — HEPARIN SODIUM (PORCINE) 1000 UNIT/ML IJ SOLN
INTRAMUSCULAR | Status: AC
  Filled 2021-08-03: qty 10

## 2021-08-03 MED ORDER — SODIUM CHLORIDE 0.9 % IV SOLN: 250.0000 mL | INTRAVENOUS | Status: DC | PRN

## 2021-08-03 SURGICAL SUPPLY — 16 items
CATH 5FR JL3.5 JR4 ANG PIG MP (CATHETERS) ×1 IMPLANT
CATH VISTA GUIDE 6FR XB3 (CATHETERS) ×1 IMPLANT
DEVICE RAD TR BAND REGULAR (VASCULAR PRODUCTS) ×1 IMPLANT
DRAPE BRACHIAL (DRAPES) ×1 IMPLANT
GLIDESHEATH SLEND SS 6F .021 (SHEATH) ×1 IMPLANT
GUIDEWIRE INQWIRE 1.5J.035X260 (WIRE) IMPLANT
INQWIRE 1.5J .035X260CM (WIRE) ×2
KIT ENCORE 26 ADVANTAGE (KITS) ×1 IMPLANT
PACK CARDIAC CATH (CUSTOM PROCEDURE TRAY) ×2 IMPLANT
PROTECTION STATION PRESSURIZED (MISCELLANEOUS) ×2
SET ATX SIMPLICITY (MISCELLANEOUS) ×1 IMPLANT
STATION PROTECTION PRESSURIZED (MISCELLANEOUS) IMPLANT
TUBING CIL FLEX 10 FLL-RA (TUBING) ×1 IMPLANT
WIRE G HI TQ BMW 190 (WIRE) ×1 IMPLANT
WIRE HI TORQ WHISPER MS 190CM (WIRE) ×1 IMPLANT
WIRE RUNTHROUGH .014X180CM (WIRE) ×1 IMPLANT

## 2021-08-03 NOTE — Progress Notes (Signed)
? ? ? ?Progress Note  ? ? ?Danny Scott  [id number] DOB: November 23, 1975  DOA: 08/02/2021 ?PCP: Pcp, No  ? ? ? ? ?Brief Narrative:  ? ? ?Medical records reviewed and are as summarized below: ? ?Danny Scott is a 46 y.o. male with medical history significant for hypertension, tobacco use disorder, vertigo, who presented to the hospital because of left-sided chest pain.  Troponins were elevated (21,575). ? ?He was admitted to the hospital for acute NSTEMI.  He was treated with IV heparin drip.  Cardiologist was consulted to assist with management. ? ? ? ? ? ? ?Assessment/Plan:  ? ?Principal Problem: ?  NSTEMI (non-ST elevated myocardial infarction) ([place]) ?Active Problems: ?  Hypertension ? ? ? ?Body mass index is 36.13 kg/m?.  (Obesity) ? ? ?Acute NSTEMI: Continue aspirin, Lipitor.  On IV heparin drip.  Plan for left heart cath today. ? ?Tobacco use disorder: Counseled to quit smoking.  He has cut down on cigarettes to only 2 cigarettes a day. ? ?Dyslipidemia: Triglycerides 172, total cholesterol 199, HDL 39 and LDL 126.  Continue Lipitor ? ?Mild hyperglycemia: Check hemoglobin A1c ? ?Diet Order   ? ?       ?  Diet Heart Room service appropriate? Yes; Fluid consistency: Thin  Diet effective now       ?  ? ?  ?  ? ?  ? ? ? ? ? ? ? ? ?Consultants: ?Cardiologist ? ?Procedures: ?Heart cath ? ? ? ?Medications:  ? ? [START ON 08/04/2021] aspirin EC  325 mg Oral Daily  ? [MAR Hold] atorvastatin  40 mg Oral QPM  ? [START ON 08/04/2021] clopidogrel  75 mg Oral Q breakfast  ? [MAR Hold] metoprolol succinate  25 mg Oral Daily  ? [MAR Hold] sodium chloride flush  3 mL Intravenous Q12H  ? sodium chloride flush  3 mL Intravenous Q12H  ? ?Continuous Infusions: ? sodium chloride    ? sodium chloride    ? sodium chloride Stopped (08/03/21 1351)  ? sodium chloride 1 mL/kg/hr (08/03/21 1351)  ? heparin Stopped (08/03/21 1156)  ? ? ? ?Anti-infectives (From admission, onward)  ? ? None  ? ?  ? ? ? ? ? ? ? ? ? ?Family  Communication/Anticipated D/C date and plan/Code Status  ? ?DVT prophylaxis:  ? ? ?  Code Status: Full Code ? ?Family Communication: Plan discussed with his wife at the bedside ?Disposition Plan: Plan to discharge home tomorrow ? ? ?Status is: Inpatient ?Remains inpatient appropriate because: NSTEMI with plan for heart cath ? ? ? ? ? ? ?Subjective:  ? ?Interval events noted.  No chest pain or shortness of breath.  He feels better.  His wife is at the bedside. ? ?Objective:  ? ? ?Vitals:  ? 08/03/21 1330 08/03/21 1345 08/03/21 1400 08/03/21 1415  ?BP: (!) 110/94 128/89 (!) 129/95   ?Pulse: 80 81 82 80  ?Resp: 19 17 17 15   ?Temp:      ?TempSrc:      ?SpO2: 97% 96% 99% 97%  ?Weight:      ?Height:      ? ?No data found. ? ? ?Intake/Output Summary (Last 24 hours) at 08/03/2021 1444 ?Last data filed at 08/02/2021 1700 ?Gross per 24 hour  ?Intake 276.69 ml  ?Output --  ?Net 276.69 ml  ? ?Filed Weights  ? 08/02/21 0723  ?Weight: 83.9 kg  ? ? ?Exam: ? ?GEN: NAD ?SKIN: No rash ?EYES: EOMI ?ENT: MMM ?CV:  RRR ?PULM: CTA B ?ABD: soft, obese, NT, +BS ?CNS: AAO x 3, non focal ?EXT: No edema or tenderness ? ? ? ?  ? ? ?Data Reviewed:  ? ?I have personally reviewed following labs and imaging studies: ? ?Labs: ?Labs show the following:  ? ?Basic Metabolic Panel: ?Recent Labs  ?Lab 08/02/21 ?0726 08/03/21 ?0705  ?NA 137 136  ?K 4.1 3.9  ?CL 107 104  ?CO2 22 24  ?GLUCOSE 137* 113*  ?BUN 18 19  ?CREATININE 1.02 1.03  ?CALCIUM 9.1 8.7*  ? ?GFR ?Estimated Creatinine Clearance: 80.6 mL/min (by C-G formula based on SCr of 1.03 mg/dL). ?Liver Function Tests: ?No results for input(s): AST, ALT, ALKPHOS, BILITOT, PROT, ALBUMIN in the last 168 hours. ?No results for input(s): LIPASE, AMYLASE in the last 168 hours. ?No results for input(s): AMMONIA in the last 168 hours. ?Coagulation profile ?Recent Labs  ?Lab 08/02/21 ?0827  ?INR 0.9  ? ? ?CBC: ?Recent Labs  ?Lab 08/02/21 ?0726 08/03/21 ?0705  ?WBC 14.3* 12.6*  ?HGB 15.8 15.0  ?HCT 49.0 46.7   ?MCV 82.6 82.5  ?PLT 257 240  ? ?Cardiac Enzymes: ?No results for input(s): CKTOTAL, CKMB, CKMBINDEX, TROPONINI in the last 168 hours. ?BNP (last 3 results) ?No results for input(s): PROBNP in the last 8760 hours. ?CBG: ?No results for input(s): GLUCAP in the last 168 hours. ?D-Dimer: ?No results for input(s): DDIMER in the last 72 hours. ?Hgb A1c: ?No results for input(s): HGBA1C in the last 72 hours. ?Lipid Profile: ?Recent Labs  ?  08/03/21 ?0705  ?CHOL 199  ?HDL 39*  ?LDLCALC 126*  ?TRIG 172*  ?CHOLHDL 5.1  ? ?Thyroid function studies: ?No results for input(s): TSH, T4TOTAL, T3FREE, THYROIDAB in the last 72 hours. ? ?Invalid input(s): FREET3 ?Anemia work up: ?No results for input(s): VITAMINB12, FOLATE, FERRITIN, TIBC, IRON, RETICCTPCT in the last 72 hours. ?Sepsis Labs: ?Recent Labs  ?Lab 08/02/21 ?0726 08/03/21 ?0705  ?WBC 14.3* 12.6*  ? ? ?Microbiology ?No results found for this or any previous visit (from the past 240 hour(s)). ? ?Procedures and diagnostic studies: ? ?DG Chest 2 View ? ?Result Date: 08/02/2021 ?CLINICAL DATA:  Pt states he has had chest pain since last Thursday, pain radiates from left side of chest through left arm, no hx heart or lung conditions EXAM: CHEST - 2 VIEW COMPARISON:  none FINDINGS: Lungs are clear. Heart size and mediastinal contours are within normal limits. No effusion. Visualized bones unremarkable. IMPRESSION: No acute cardiopulmonary disease. Electronically Signed   By: Lucrezia Europe M.D.   On: 08/02/2021 07:55  ? ?CT Angio Chest Aorta W and/or Wo Contrast ? ?Result Date: 08/02/2021 ?CLINICAL DATA:  Acute aortic syndrome (AAS) suspected EXAM: CT ANGIOGRAPHY CHEST WITH CONTRAST TECHNIQUE: Multidetector CT imaging of the chest was performed using the standard protocol during bolus administration of intravenous contrast. Multiplanar CT image reconstructions and MIPs were obtained to evaluate the vascular anatomy. RADIATION DOSE REDUCTION: This exam was performed according to the  departmental dose-optimization program which includes automated exposure control, adjustment of the mA and/or kV according to patient size and/or use of iterative reconstruction technique. CONTRAST:  165mL OMNIPAQUE IOHEXOL 350 MG/ML SOLN COMPARISON:  None. FINDINGS: Cardiovascular: Thoracic aorta is normal in caliber with mixed plaque along the arch. No evidence of intramural hematoma or dissection. There is no evidence of a central pulmonary embolism. Heart size is normal. No pericardial effusion. Coronary artery calcification. Mediastinum/Nodes: No enlarged nodes.  Normal thyroid. Lungs/Pleura: No consolidation or mass. No pleural  effusion or pneumothorax. Upper Abdomen: No acute abnormality. Musculoskeletal: No acute osseous abnormality. Review of the MIP images confirms the above findings. IMPRESSION: No evidence of aortic dissection or other acute abnormality. Electronically Signed   By: Macy Mis M.D.   On: 08/02/2021 09:34  ? ?ECHOCARDIOGRAM COMPLETE ? ?Result Date: 08/03/2021 ?   ECHOCARDIOGRAM REPORT   Patient Name:   MANJINDER CARAWAY Date of Exam: 08/02/2021 Medical Rec #:  [id number]                Height:       60.0 in Accession #:    [id number]               Weight:       185.0 lb Date of Birth:  [birth year]                BSA:          1.806 m? Patient Age:    89 years                 BP:           153/110 mmHg Patient Gender: M                        HR:           76 bpm. Exam Location:  [facility] Procedure: 2D Echo, Cardiac Doppler and Color Doppler Indications:     R07.9 Chest pain  History:         Patient has no prior history of Echocardiogram examinations.                  Signs/Symptoms:NSTEMI; Risk Factors:Hypertension and Current                  Smoker.  Sonographer:     Rosalia Hammers Referring Phys:  [id number] [place] [facility] Diagnosing Phys: Yolonda Kida MD IMPRESSIONS  1. Inferior lateral wall hypo.  2. Left ventricular ejection fraction, by estimation, is 55 to 60%. The left  ventricle has normal function. The left ventricle demonstrates regional wall motion abnormalities (see scoring diagram/findings for description). Left ventricular diastolic parameters are consistent with Grade

## 2021-08-03 NOTE — Consult Note (Signed)
ANTICOAGULATION CONSULT NOTE  ? ?Pharmacy Consult for Heparin ?Indication: chest pain/ACS ? ?No Known Allergies ? ?Patient Measurements: ?Height: 5' (152.4 cm) ?Weight: 83.9 kg (185 lb) ?IBW/kg (Calculated) : 50 ?Heparin Dosing Weight: 68.9 kg ? ?Vital Signs: ?Temp: 98.1 ?F (36.7 ?C) (04/18 2831) ?Temp Source: Oral (04/18 5176) ?BP: 115/80 (04/18 0528) ?Pulse Rate: 93 (04/18 0528) ? ?Labs: ?Recent Labs  ?  08/02/21 ?0726 08/02/21 ?0827 08/02/21 ?1134 08/02/21 ?1246 08/02/21 ?1618 08/02/21 ?2253 08/03/21 ?0705  ?HGB 15.8  --   --   --   --   --  15.0  ?HCT 49.0  --   --   --   --   --  46.7  ?PLT 257  --   --   --   --   --  240  ?APTT  --  29  --   --   --   --   --   ?LABPROT  --  12.4  --   --   --   --   --   ?INR  --  0.9  --   --   --   --   --   ?HEPARINUNFRC  --   --   --   --  0.10* 0.25* 0.26*  ?CREATININE 1.02  --   --   --   --   --   --   ?TROPONINIHS 21,575*  --  19,560* 16,073*  --   --   --   ? ? ? ?Estimated Creatinine Clearance: 81.4 mL/min (by C-G formula based on SCr of 1.02 mg/dL). ? ? ?Medical History: ?Past Medical History:  ?Diagnosis Date  ? Hypertension   ? Vertigo   ?Heparin Dosing Weight: 68.9 kg ? ?Medications:  ?No history of chronic anticoagulation PTA  ? ?Assessment: ?Pharmacy has been consulted to initiate heparin on 46yo patient presenting to the ED with chest discomfort. Initial troponin level of 21,575. Cardiology team has been consulted.  ? ?Date Time aPTT/HL Rate/Comment ?4/17 1618 0.1  [facility]; 850 > 1100 un/hr ?4/17 2253 0.25  [facility]; 1100 > 1250 un/hr ?4/18 0705 0.26  [facility]; 1250 > 1400 un/hr     ? ?Baseline Labs: ?aPTT - 29s ?INR - 0.9 ?Hgb - 15.8 ?Plts - 257 ? ?Goal of Therapy:  ?Heparin level 0.3-0.7 units/ml ?Monitor platelets by anticoagulation protocol: Yes ?  ?Plan:  ?Give 1000 units bolus x1; increase heparin infusion to 1400 units/hr ?Recheck HL in ~6 hr and daily while on heparin ?Continue to monitor H&H and platelets ? ?Martyn Malay, PharmD, BCCP ?Clinical  Pharmacist ?08/03/2021 7:49 AM ? ? ? ? ?

## 2021-08-03 NOTE — Progress Notes (Signed)
Patient CHG bath completed. Right groin site clipped. ?

## 2021-08-03 NOTE — Progress Notes (Signed)
Pt's chart indicated that patient prefers spanish interpretor for communication. Discussed with patient and patient stated that he does not need interpretor. Educated patient that at any time he may request an interpretor.  ?

## 2021-08-04 ENCOUNTER — Encounter: Payer: Self-pay | Admitting: Internal Medicine

## 2021-08-04 DIAGNOSIS — E669 Obesity, unspecified: Secondary | ICD-10-CM

## 2021-08-04 DIAGNOSIS — F17200 Nicotine dependence, unspecified, uncomplicated: Secondary | ICD-10-CM

## 2021-08-04 MED ORDER — ATORVASTATIN CALCIUM 80 MG PO TABS
80.0000 mg | ORAL_TABLET | Freq: Every evening | ORAL | 0 refills | Status: AC
Start: 2021-08-04 — End: 2021-09-03

## 2021-08-04 MED ORDER — ATORVASTATIN CALCIUM 80 MG PO TABS: 80.0000 mg | ORAL_TABLET | Freq: Every evening | ORAL | Status: DC

## 2021-08-04 MED ORDER — CLOPIDOGREL BISULFATE 75 MG PO TABS: 75.0000 mg | ORAL_TABLET | Freq: Every day | ORAL | 0 refills | Status: AC

## 2021-08-04 MED ORDER — ASPIRIN 325 MG PO TBEC
325.0000 mg | DELAYED_RELEASE_TABLET | Freq: Every day | ORAL | 0 refills | Status: DC
Start: 2021-08-05 — End: 2021-08-04

## 2021-08-04 MED ORDER — LOSARTAN POTASSIUM 25 MG PO TABS
25.0000 mg | ORAL_TABLET | Freq: Every day | ORAL | 0 refills | Status: AC
Start: 2021-08-05 — End: 2021-09-04

## 2021-08-04 MED ORDER — LOSARTAN POTASSIUM 25 MG PO TABS
25.0000 mg | ORAL_TABLET | Freq: Every day | ORAL | Status: DC
  Administered 2021-08-04: 25 mg via ORAL
  Filled 2021-08-04: qty 1

## 2021-08-04 MED ORDER — METOPROLOL SUCCINATE ER 25 MG PO TB24: 25.0000 mg | ORAL_TABLET | Freq: Every day | ORAL | 0 refills | Status: AC

## 2021-08-04 MED ORDER — ASPIRIN EC 81 MG PO TBEC: 81.0000 mg | DELAYED_RELEASE_TABLET | Freq: Every day | ORAL | Status: AC

## 2021-08-04 NOTE — Plan of Care (Signed)
DISCHARGE NOTE HOME ?Danny Scott to be discharged home per MD order. Discussed prescriptions and follow up appointments with the patient. Medication list explained in detail. Patient verbalized understanding. ? ?Skin clean, dry and intact without evidence of skin break down, no evidence of skin tears noted. IV catheter discontinued intact. Site without signs and symptoms of complications. Dressing and pressure applied. Pt denies pain at the site currently. No complaints noted. ? ?Patient free of lines, drains, and wounds.  ? ?An After Visit Summary (AVS) was printed and given to the patient. ?Patient discharged home via private auto. ? ?Arlice Colt, RN  ?

## 2021-08-04 NOTE — Discharge Summary (Addendum)
Physician Discharge Summary  ?Danny Scott [id number] DOB: 25-Aug-1975 DOA: 08/02/2021 ? ?PCP: Pcp, No ? ?Admit date: 08/02/2021 ?Discharge date: 08/04/2021 ? ?Admitted From: home  ?Disposition:  home  ? ?Recommendations for Outpatient Follow-up:  ?Follow up with PCP in 2 weeks ?F/u w/ cardio, Dr. Clayborn Bigness, in 1 week ? ?Home Health: no  ?Equipment/Devices: ? ?Discharge Condition: stable ?CODE STATUS: full  ?Diet recommendation: Heart Healthy ? ?Brief/Interim Summary: ?HPI was taken from Dr. Francine Graven: ?Danny Scott is a 46 y.o. male with medical history significant for hypertension, nicotine dependence and vertigo who presents to the ER for evaluation of left sided chest pain that started 4 days prior to his admission.  He rates his pain a 7 x 10 in intensity at its worst and states that the pain radiated to his left arm.  He denies having any nausea, no vomiting, no shortness of breath, no diaphoresis, no palpitations, no fever, no chills, no cough, no leg swelling, no dizziness or lightheadedness, no calf pain, no blurred vision no focal deficit. ?Due to the persistence of his symptoms he presented to the ER for further evaluation. ?Labs revealed markedly elevated troponin levels and twelve-lead EKG shows normal sinus rhythm with T wave abnormality in the lateral leads and incomplete right bundle branch block. ?He does not have a personal or family history of coronary artery disease. ? ?As per Dr. Mal Misty: ?Danny Scott is a 46 y.o. male with medical history significant for hypertension, tobacco use disorder, vertigo, who presented to the hospital because of left-sided chest pain.  Troponins were elevated (21,575). ?  ?He was admitted to the hospital for acute NSTEMI.  He was treated with IV heparin drip.  Cardiologist was consulted to assist with management. ? ?As per Dr. Jimmye Norman 08/04/21: Pt had a cardiac cath on 08/03/21 which showed large caliber LCx w/ 100% proximal to mid lesion  but unable to intervened upon but pt will pt treated medically w/ aspirin, plavix, metoprolol, losartan & statin as per cardio. Pt will w/ cardio, Dr. Clayborn Bigness, in 1 week. Pt verbalized his understanding. For more information, please see previous progress/consult notes.  ? ? ?Discharge Diagnoses:  ?Principal Problem: ?  NSTEMI (non-ST elevated myocardial infarction) ([place]) ?Active Problems: ?  Hypertension ? ?Acute NSTEMI: s/p cardiac cath which showed large caliber Lcx 100% proximal to mid lesion but unable to intervene upon as per cardio. Continue on aspirin, plavix, metoprolol, losartan & statin  ? ?Tobacco use disorder: received smoking cessation counseling.  He has cut down on cigarettes to only 2 cigarettes a day. ?  ?HLD: continue on statin  ?  ?Mild hyperglycemia: HbA1c is 5.2.  ? ?Obesity: BMI 36.1. Complicates overall care & prognosis ? ?Discharge Instructions ? ?Discharge Instructions   ? ? Diet - low sodium heart healthy   Complete by: As directed ?  ? Discharge instructions   Complete by: As directed ?  ? F/u w/ cardio, Dr. Clayborn Bigness, in 1 week. Do not lift more than 10 pounds for 7-10 days w/ wrist used for cardiac cath. F/u w/ PCP in 2 weeks  ? Increase activity slowly   Complete by: As directed ?  ? ?  ? ?Allergies as of 08/04/2021   ?No Known Allergies ?  ? ?  ?Medication List  ?  ? ?TAKE these medications   ? ?aspirin EC 81 MG tablet ?Take 1 tablet (81 mg total) by mouth daily. Swallow whole. ?  ?atorvastatin 80 MG tablet ?Commonly known as:  LIPITOR ?Take 1 tablet (80 mg total) by mouth every evening. ?  ?clopidogrel 75 MG tablet ?Commonly known as: PLAVIX ?Take 1 tablet (75 mg total) by mouth daily with breakfast. ?Start taking on: August 05, 2021 ?  ?losartan 25 MG tablet ?Commonly known as: COZAAR ?Take 1 tablet (25 mg total) by mouth daily. ?Start taking on: August 05, 2021 ?  ?metoprolol succinate 25 MG 24 hr tablet ?Commonly known as: TOPROL-XL ?Take 1 tablet (25 mg total) by mouth daily. ?Start  taking on: August 05, 2021 ?  ? ?  ? ? Follow-up Information   ? ? Callwood, Dwayne D, MD Follow up in 1 week(s).   ?Specialties: Cardiology, Internal Medicine ?Contact information: ?[address] ?[place] [address] ?[phone] ? ? ?  ?  ? ?  ?  ? ?  ? ?No Known Allergies ? ?Consultations: ?Cardio  ? ? ?Procedures/Studies: ?DG Chest 2 View ? ?Result Date: 08/02/2021 ?CLINICAL DATA:  Pt states he has had chest pain since last Thursday, pain radiates from left side of chest through left arm, no hx heart or lung conditions EXAM: CHEST - 2 VIEW COMPARISON:  none FINDINGS: Lungs are clear. Heart size and mediastinal contours are within normal limits. No effusion. Visualized bones unremarkable. IMPRESSION: No acute cardiopulmonary disease. Electronically Signed   By: Lucrezia Europe M.D.   On: 08/02/2021 07:55  ? ?CARDIAC CATHETERIZATION ? ?Result Date: 08/04/2021 ?  Prox Cx lesion is 100% stenosed.   The left ventricular systolic function is normal.   LV end diastolic pressure is normal.   The left ventricular ejection fraction is 50-55% by visual estimate.   Unsuccessful PCI and stent to circumflex unable to cross with a wire Conclusion Diagnostic cardiac cath left heart radial approach because of non-STEMI Normal left ventricular function mild inferior lateral hypo- Ejection fraction around 50 to 55% Coronary Left main free of disease LAD large no significant CAD Circumflex large 100% proximal to mid lesion Large right coronary artery disease Intervention Unsuccessful intervention of proximal circumflex unable to cross with a wire Case reported Patient treated with aspirin Plavix beta-blocker ARB statin Follow-up with cardiology 1 to 2 weeks Consider referral to tertiary care center for possible attempted 100% occlusion circumflex TIMI 0 flow before and after the case No complications  ? ?CT Angio Chest Aorta W and/or Wo Contrast ? ?Result Date: 08/02/2021 ?CLINICAL DATA:  Acute aortic syndrome (AAS) suspected  EXAM: CT ANGIOGRAPHY CHEST WITH CONTRAST TECHNIQUE: Multidetector CT imaging of the chest was performed using the standard protocol during bolus administration of intravenous contrast. Multiplanar CT image reconstructions and MIPs were obtained to evaluate the vascular anatomy. RADIATION DOSE REDUCTION: This exam was performed according to the departmental dose-optimization program which includes automated exposure control, adjustment of the mA and/or kV according to patient size and/or use of iterative reconstruction technique. CONTRAST:  16mL OMNIPAQUE IOHEXOL 350 MG/ML SOLN COMPARISON:  None. FINDINGS: Cardiovascular: Thoracic aorta is normal in caliber with mixed plaque along the arch. No evidence of intramural hematoma or dissection. There is no evidence of a central pulmonary embolism. Heart size is normal. No pericardial effusion. Coronary artery calcification. Mediastinum/Nodes: No enlarged nodes.  Normal thyroid. Lungs/Pleura: No consolidation or mass. No pleural effusion or pneumothorax. Upper Abdomen: No acute abnormality. Musculoskeletal: No acute osseous abnormality. Review of the MIP images confirms the above findings. IMPRESSION: No evidence of aortic dissection or other acute abnormality. Electronically Signed   By: Macy Mis M.D.   On: 08/02/2021  09:34  ? ?ECHOCARDIOGRAM COMPLETE ? ?Result Date: 08/03/2021 ?   ECHOCARDIOGRAM REPORT   Patient Name:   Danny Scott Date of Exam: 08/02/2021 Medical Rec #:  [id number]                Height:       60.0 in Accession #:    [id number]               Weight:       185.0 lb Date of Birth:  Jul 14, 1975                BSA:          1.806 m? Patient Age:    24 years                 BP:           153/110 mmHg Patient Gender: M                        HR:           76 bpm. Exam Location:  [facility] Procedure: 2D Echo, Cardiac Doppler and Color Doppler Indications:     R07.9 Chest pain  History:         Patient has no prior history of Echocardiogram  examinations.                  Signs/Symptoms:NSTEMI; Risk Factors:Hypertension and Current                  Smoker.  Sonographer:     Rosalia Hammers Referring Phys:  [id number] [place] TANG Diagnosing Phys: Karma Greaser

## 2021-08-04 NOTE — Progress Notes (Signed)
?[facility] CARDIOLOGY CONSULT NOTE  ? ?    ?Patient ID: ?Danny Scott ?MRN: [id number] ?DOB/AGE: 1975/04/25 46 y.o. ? ?Admit date: 08/02/2021 ?Referring Physician Dr. Jene Every  ?Primary Physician none ?Primary Cardiologist none ?Reason for Consultation chest pain ? ?HPI: Danny Scott is a 46 year old male with a past medical history of vertigo and ongoing tobacco use who presented to [facility] ED 08/02/2021 with chest pain x4 days and [place]onin of [address].  Cardiology is consulted for further assistance. ? ?Interval History:  ?- LHC performed 4/18 with Dr. Juliann Pares revealed large caliber Lcx with 100% proximal to mid lesion unable to be intervened upon.  ?- denies further chest pain since the heart cath, feels well this morning and is eager to go home.  ?- has ambulated in the room without difficulty ? ?Review of systems complete and found to be negative unless listed above  ? ? ? ?Past Medical History:  ?Diagnosis Date  ? Hypertension   ? Vertigo   ?  ?History reviewed. No pertinent surgical history.  ?No medications prior to admission.  ? ? ?Social History  ? ?Socioeconomic History  ? Marital status: Single  ?  Spouse name: Not on file  ? Number of children: Not on file  ? Years of education: Not on file  ? Highest education level: Not on file  ?Occupational History  ? Not on file  ?Tobacco Use  ? Smoking status: Every Day  ?  Packs/day: 0.50  ?  Types: Cigarettes  ? Smokeless tobacco: Never  ?Vaping Use  ? Vaping Use: Never used  ?Substance and Sexual Activity  ? Alcohol use: Not on file  ? Drug use: Not on file  ? Sexual activity: Not on file  ?Other Topics Concern  ? Not on file  ?Social History Narrative  ? Not on file  ? ?Social Determinants of Health  ? ?Financial Resource Strain: Not on file  ?Food Insecurity: Not on file  ?Transportation Needs: Not on file  ?Physical Activity: Not on file  ?Stress: Not on file  ?Social Connections: Not on file  ?Intimate Partner Violence:  Not on file  ?  ?History reviewed. No pertinent family history.  ? ? ?Review of systems complete and found to be negative unless listed above  ? ? ?PHYSICAL EXAM ?General: Pleasant middle-aged Hispanic male, well nourished, in no acute distress.  Laying at incline in PCU bed.  ?HEENT:  Normocephalic and atraumatic. ?Neck:  No JVD.  ?Lungs: Normal respiratory effort on room air. Clear bilaterally to auscultation. No wheezes, crackles, rhonchi.  ?Heart: HRRR . Normal S1 and S2 without gallops or murmurs. ?Abdomen: Obese appearing.  ?Msk: Normal strength and tone for age. ?Extremities: Warm and well perfused. No clubbing, cyanosis.  No peripheral edema. R radial access site without significant tenderness to palpation covered in gauze and tegaderm. Minimal swelling proximally.  ?Neuro: Alert and oriented X 3. ?Psych:  Answers questions appropriately.  ? ?Labs: ?  ?Lab Results  ?Component Value Date  ? WBC 12.6 (H) 08/03/2021  ? HGB 15.0 08/03/2021  ? HCT 46.7 08/03/2021  ? MCV 82.5 08/03/2021  ? PLT 240 08/03/2021  ?  ?Recent Labs  ?Lab 08/03/21 ?0705  ?NA 136  ?K 3.9  ?CL 104  ?CO2 24  ?BUN 19  ?CREATININE 1.03  ?CALCIUM 8.7*  ?GLUCOSE 113*  ? ? ?No results found for: CKTOTAL, CKMB, CKMBINDEX, TROPONINI  ?Lab Results  ?Component Value Date  ? CHOL 199 08/03/2021  ? ?  Lab Results  ?Component Value Date  ? HDL 39 (L) 08/03/2021  ? ?Lab Results  ?Component Value Date  ? LDLCALC 126 (H) 08/03/2021  ? ?Lab Results  ?Component Value Date  ? TRIG 172 (H) 08/03/2021  ? ?Lab Results  ?Component Value Date  ? CHOLHDL 5.1 08/03/2021  ? ?No results found for: LDLDIRECT  ?  ?Radiology: DG Chest 2 View ? ?Result Date: 08/02/2021 ?CLINICAL DATA:  Pt states he has had chest pain since last Thursday, pain radiates from left side of chest through left arm, no hx heart or lung conditions EXAM: CHEST - 2 VIEW COMPARISON:  none FINDINGS: Lungs are clear. Heart size and mediastinal contours are within normal limits. No effusion. Visualized  bones unremarkable. IMPRESSION: No acute cardiopulmonary disease. Electronically Signed   By: Corlis Leak M.D.   On: 08/02/2021 07:55  ? ?CT Angio Chest Aorta W and/or Wo Contrast ? ?Result Date: 08/02/2021 ?CLINICAL DATA:  Acute aortic syndrome (AAS) suspected EXAM: CT ANGIOGRAPHY CHEST WITH CONTRAST TECHNIQUE: Multidetector CT imaging of the chest was performed using the standard protocol during bolus administration of intravenous contrast. Multiplanar CT image reconstructions and MIPs were obtained to evaluate the vascular anatomy. RADIATION DOSE REDUCTION: This exam was performed according to the departmental dose-optimization program which includes automated exposure control, adjustment of the mA and/or kV according to patient size and/or use of iterative reconstruction technique. CONTRAST:  OMNIPAQUE IOHEXOL 350 MG/ML SOLN COMPARISON:  None. FINDINGS: Cardiovascular: Thoracic aorta is normal in caliber with mixed plaque along the arch. No evidence of intramural hematoma or dissection. There is no evidence of a central pulmonary embolism. Heart size is normal. No pericardial effusion. Coronary artery calcification. Mediastinum/Nodes: No enlarged nodes.  Normal thyroid. Lungs/Pleura: No consolidation or mass. No pleural effusion or pneumothorax. Upper Abdomen: No acute abnormality. Musculoskeletal: No acute osseous abnormality. Review of the MIP images confirms the above findings. IMPRESSION: No evidence of aortic dissection or other acute abnormality. Electronically Signed   By: Guadlupe Spanish M.D.   On: 08/02/2021 09:34  ? ?ECHOCARDIOGRAM COMPLETE ? ?Result Date: 08/03/2021 ?   ECHOCARDIOGRAM REPORT   Patient Name:   Danny Scott Date of Exam: 08/02/2021 Medical Rec #:  [id number]                Height:       60.0 in Accession #:    [id number]               Weight:       185.0 lb Date of Birth:  07-08-1975                BSA:          1.806 m? Patient Age:    45 years                 BP:            153/110 mmHg Patient Gender: M                        HR:           76 bpm. Exam Location:  [facility] Procedure: 2D Echo, Cardiac Doppler and Color Doppler Indications:     R07.9 Chest pain  History:         Patient has no prior history of Echocardiogram examinations.                  Signs/Symptoms:NSTEMI;  Risk Factors:Hypertension and Current                  Smoker.  Sonographer:     Ceasar MonsJennifer Broe Referring Phys:  [id number] Tonna CornerLILY MICHELLE Dylin Breeden Diagnosing Phys: Alwyn Peawayne D Callwood MD IMPRESSIONS  1. Inferior lateral wall hypo.  2. Left ventricular ejection fraction, by estimation, is 55 to 60%. The left ventricle has normal function. The left ventricle demonstrates regional wall motion abnormalities (see scoring diagram/findings for description). Left ventricular diastolic parameters are consistent with Grade I diastolic dysfunction (impaired relaxation).  3. Right ventricular systolic function is normal. The right ventricular size is normal.  4. The mitral valve is grossly normal. No evidence of mitral valve regurgitation.  5. The aortic valve is normal in structure. Aortic valve regurgitation is not visualized. FINDINGS  Left Ventricle: Left ventricular ejection fraction, by estimation, is 55 to 60%. The left ventricle has normal function. The left ventricle demonstrates regional wall motion abnormalities. The left ventricular internal cavity size was normal in size. There is borderline concentric left ventricular hypertrophy. Left ventricular diastolic parameters are consistent with Grade I diastolic dysfunction (impaired relaxation). Right Ventricle: The right ventricular size is normal. No increase in right ventricular wall thickness. Right ventricular systolic function is normal. Left Atrium: Left atrial size was normal in size. Right Atrium: Right atrial size was normal in size. Pericardium: There is no evidence of pericardial effusion. Mitral Valve: The mitral valve is grossly normal. No evidence of mitral valve  regurgitation. MV peak gradient, 2.7 mmHg. The mean mitral valve gradient is 1.0 mmHg. Tricuspid Valve: The tricuspid valve is normal in structure. Tricuspid valve regurgitation is trivial. Aortic Valve: The

## 2021-08-11 NOTE — Progress Notes (Signed)
[facility] Cardiology ? ? ? ?SUBJECTIVE: Status post cardiac cath no chest pain no shortness of breath radial site appears to be doing reasonably well.  No complications postprocedure tolerating medications well ? ? ?Vitals:  ? 08/03/21 2344 08/04/21 0339 08/04/21 0807 08/04/21 1218  ?BP: 120/79 125/82 (!) 132/97 (!) 134/94  ?Pulse: 95 93 87 78  ?Resp: 20 20 18 19   ?Temp: 99.5 ?F (37.5 ?C) 99.3 ?F (37.4 ?C) 98 ?F (36.7 ?C) 97.8 ?F (36.6 ?C)  ?TempSrc:    Oral  ?SpO2: 96% 95% 98% 100%  ?Weight:      ?Height:      ? ? ?No intake or output data in the 24 hours ending 08/11/21 1953 ? ? ? ?PHYSICAL EXAM ? ?General: Well developed, well nourished, in no acute distress ?HEENT:  Normocephalic and atramatic ?Neck:  No JVD.  ?Lungs: Clear bilaterally to auscultation and percussion. ?Heart: HRRR . Normal S1 and S2 without gallops or murmurs.  ?Abdomen: Bowel sounds are positive, abdomen soft and non-tender  ?Msk:  Back normal, normal gait. Normal strength and tone for age. ?Extremities: No clubbing, cyanosis or edema.   ?Neuro: Alert and oriented X 3. ?Psych:  Good affect, responds appropriately ? ? ?LABS: ?Basic Metabolic Panel: ?No results for input(s): NA, K, CL, CO2, GLUCOSE, BUN, CREATININE, CALCIUM, MG, PHOS in the last 72 hours. ?Liver Function Tests: ?No results for input(s): AST, ALT, ALKPHOS, BILITOT, PROT, ALBUMIN in the last 72 hours. ?No results for input(s): LIPASE, AMYLASE in the last 72 hours. ?CBC: ?No results for input(s): WBC, NEUTROABS, HGB, HCT, MCV, PLT in the last 72 hours. ?Cardiac Enzymes: ?No results for input(s): CKTOTAL, CKMB, CKMBINDEX, TROPONINI in the last 72 hours. ?BNP: ?Invalid input(s): POCBNP ?D-Dimer: ?No results for input(s): DDIMER in the last 72 hours. ?Hemoglobin A1C: ?No results for input(s): HGBA1C in the last 72 hours. ?Fasting Lipid Panel: ?No results for input(s): CHOL, HDL, LDLCALC, TRIG, CHOLHDL, LDLDIRECT in the last 72 hours. ?Thyroid Function Tests: ?No results for input(s): TSH,  T4TOTAL, T3FREE, THYROIDAB in the last 72 hours. ? ?Invalid input(s): FREET3 ?Anemia Panel: ?No results for input(s): VITAMINB12, FOLATE, FERRITIN, TIBC, IRON, RETICCTPCT in the last 72 hours. ? ?No results found. ? ? ?Echo preserved left ventricular function EF around 55 to 60% ? ?TELEMETRY: Normal sinus rhythm nonspecific ST-T wave changes: ? ?ASSESSMENT AND PLAN: ? ?Principal Problem: ?  NSTEMI (non-ST elevated myocardial infarction) ([facility]) ?Active Problems: ?  Hypertension ?Status post cardiac cath with occluded proximal to mid circumflex ?Hyperlipidemia ? ?Plan ?Status post cardiac cath recommend aspirin Plavix statin beta-blocker ARB ?Recommend conservative medical therapy ?Refer the patient for CTO procedure at [facility] as outpatient ?Continue hypertension management and control ?Recommend aftercare for radial sheath removal ?We will consider referral to cardiac rehab ? ? ?[facility], MD ?08/11/2021 ?7:53 PM ? ? ? ?  ?

## 2023-10-08 IMAGING — CR DG CHEST 2V
1 series · 2 of 2 positions shown · non-contrast
Comparison: none

CLINICAL DATA: Pt states he has had chest pain since [REDACTED],
pain radiates from left side of chest through left arm, no hx heart
or lung conditions

EXAM:
CHEST - 2 VIEW

[Series 1: dg chest 2 view · 0.14mm/px · 2 of 2 slices shown]
[im 1/2]
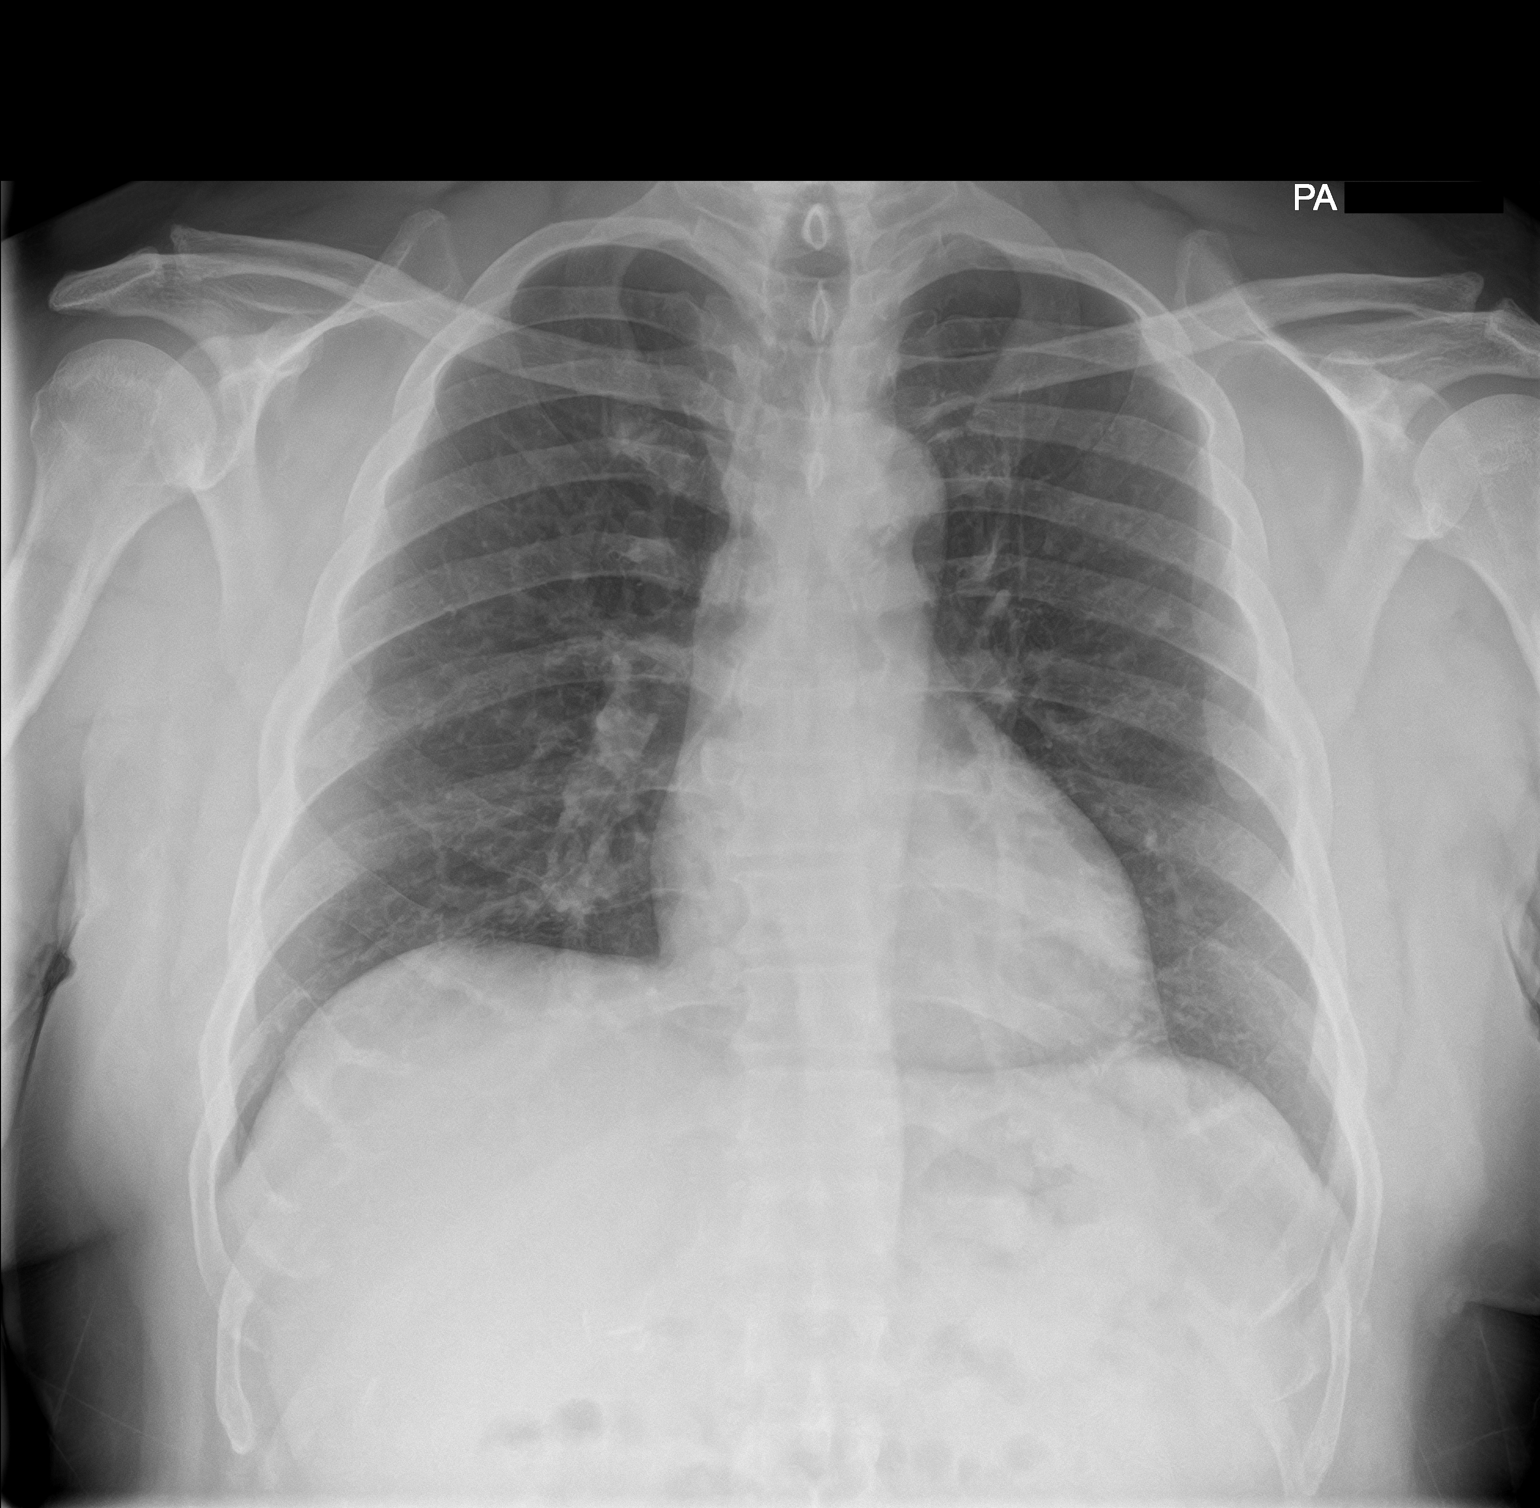
[im 2/2]
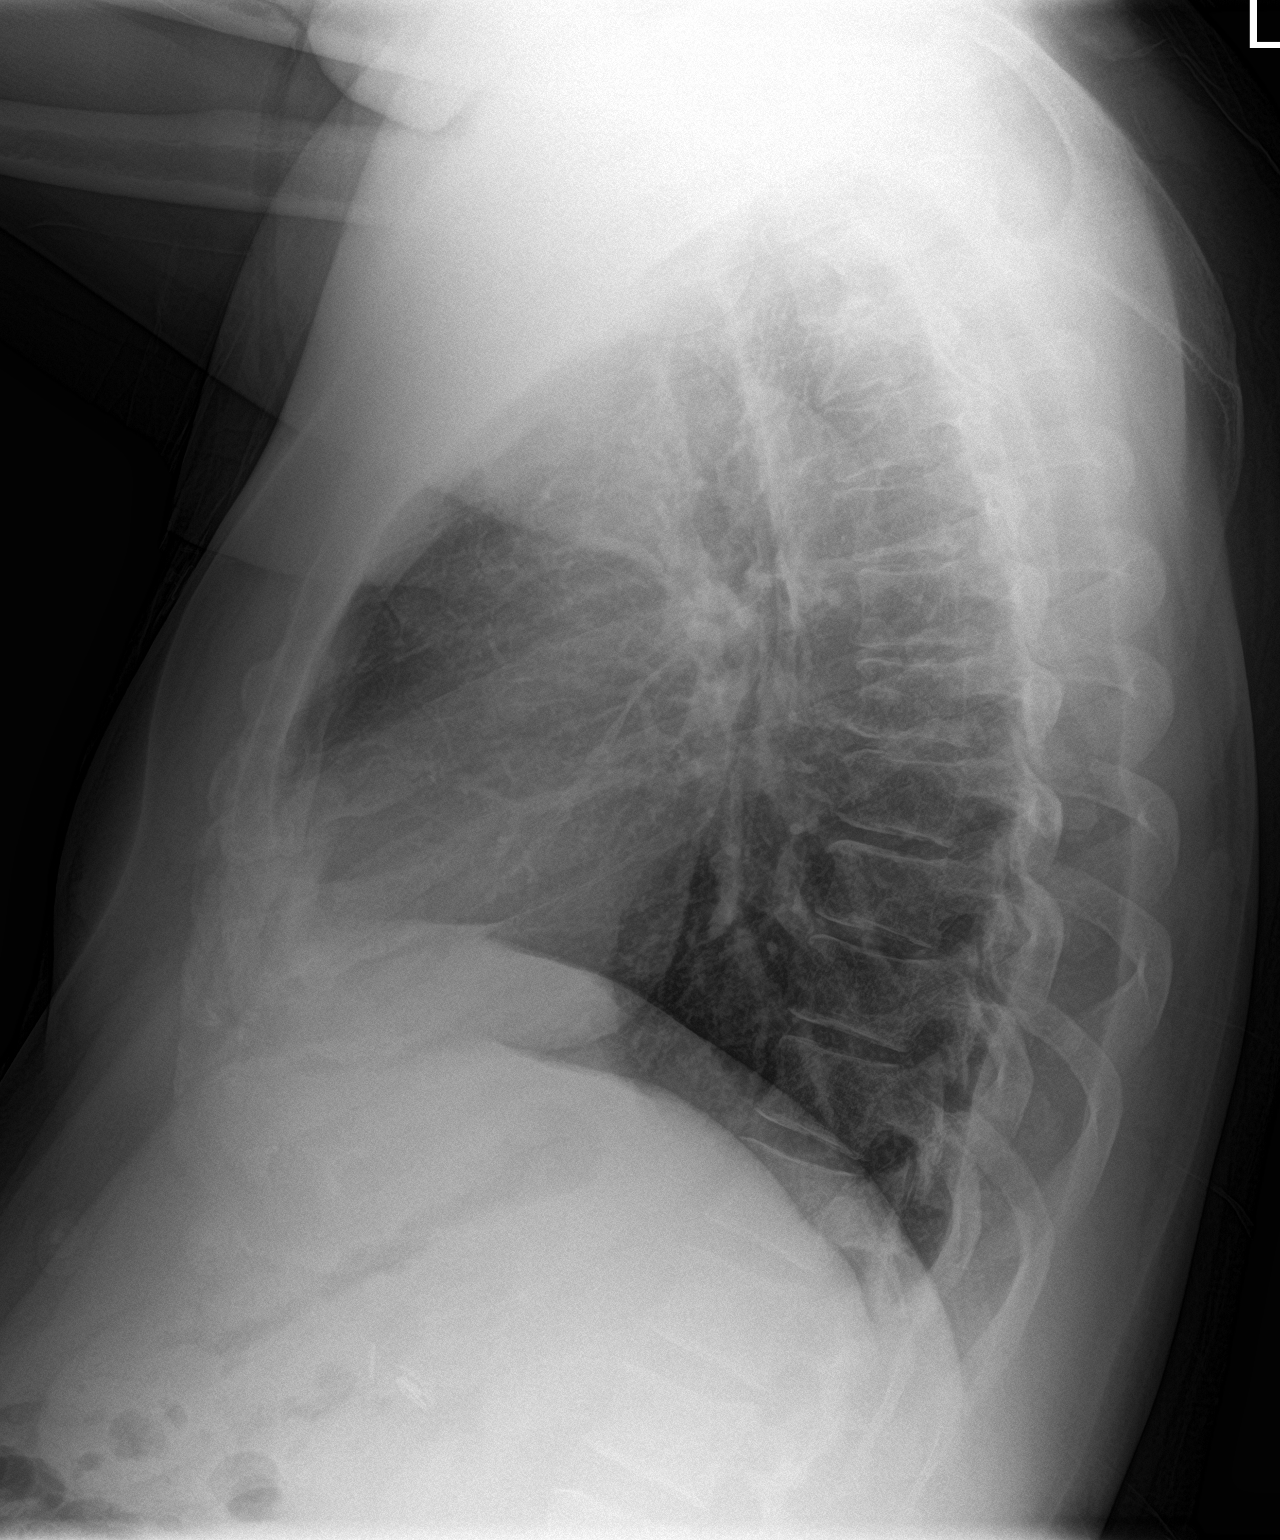

[2 of 2 positions shown; findings below may reference images not displayed]

FINDINGS: Lungs are clear.

Heart size and mediastinal contours are within normal limits.

No effusion.

Visualized bones unremarkable.
IMPRESSION: No acute cardiopulmonary disease.

## 2023-10-08 IMAGING — CT CT ANGIO CHEST
3 of 7 series · 18 of 46 positions shown · IV contrast (agent unspecified)
Comparison: None.

CLINICAL DATA: Acute aortic syndrome (AAS) suspected

EXAM:
CT ANGIOGRAPHY CHEST WITH CONTRAST
TECHNIQUE: Multidetector CT imaging of the chest was performed using the
standard protocol during bolus administration of intravenous
contrast. Multiplanar CT image reconstructions and MIPs were
obtained to evaluate the vascular anatomy.

[Series 6: chest wo · axial · 0.75mm/px · z∈[+1225,+1443]mm · 9 of 137 slices shown]
[im 14/137  lung]
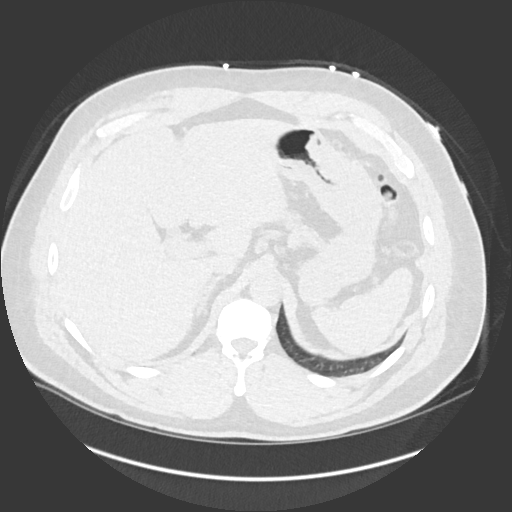
[im 28/137  soft-tissue]
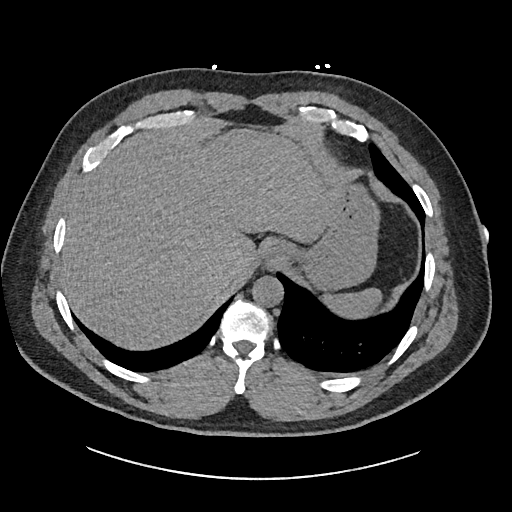
[im 41/137  lung]
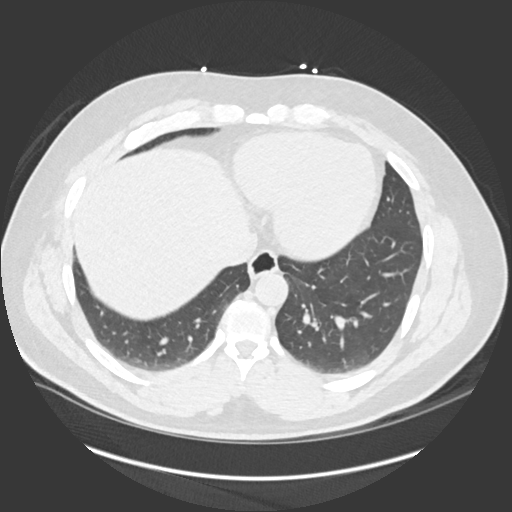
[im 55/137  soft-tissue]
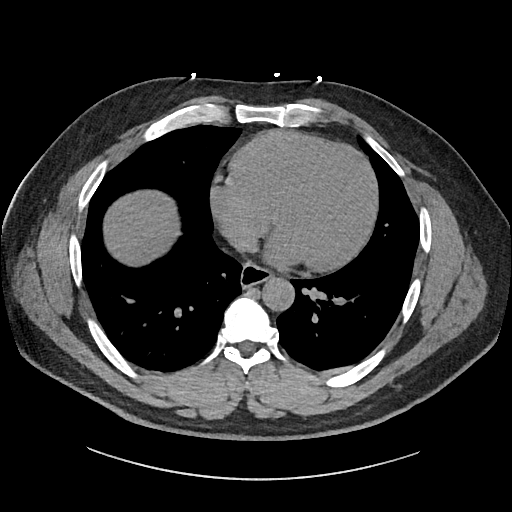
[im 69/137  lung]
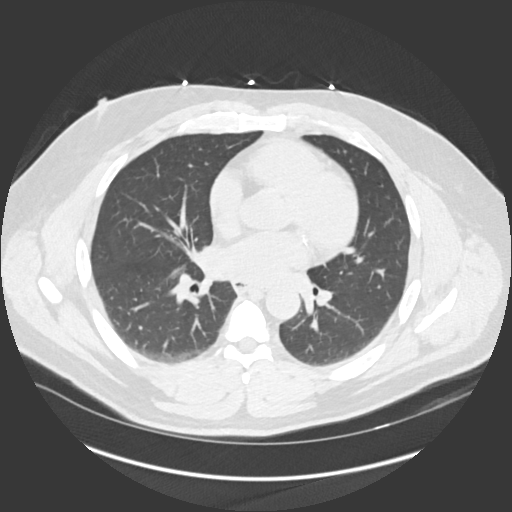
[im 82/137  soft-tissue]
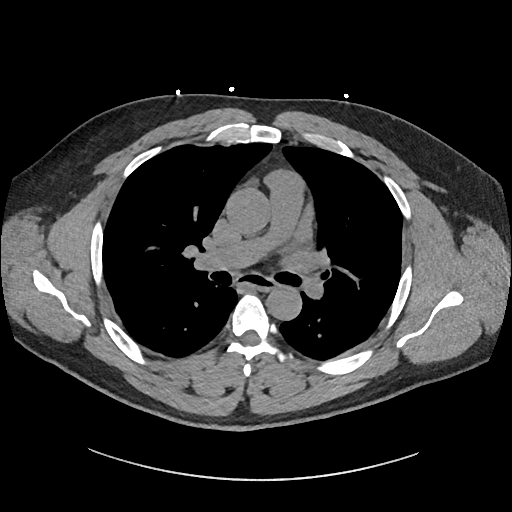
[im 96/137  lung]
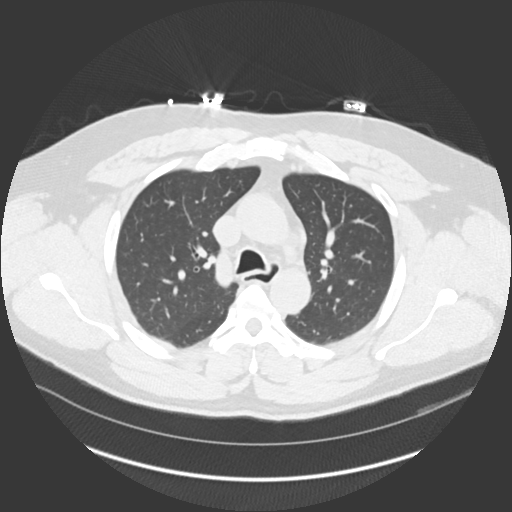
[im 109/137  soft-tissue]
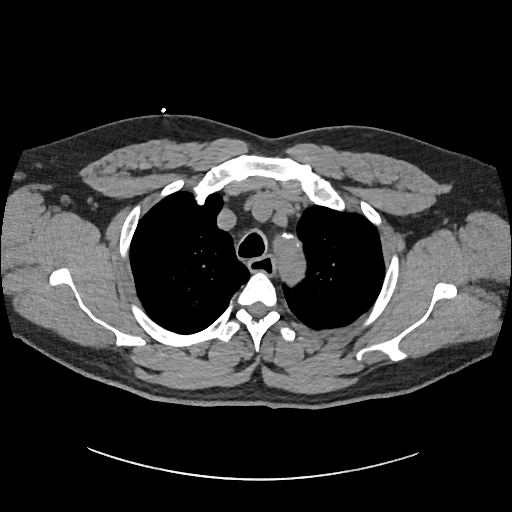
[im 123/137  lung]
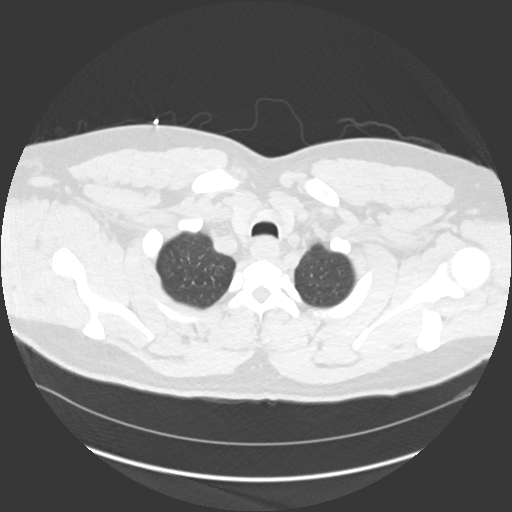

[Series 7: lung · axial · 0.68mm/px · z∈[+1227,+1389]mm · 6 of 136 slices shown]
[im 14/136  soft-tissue]
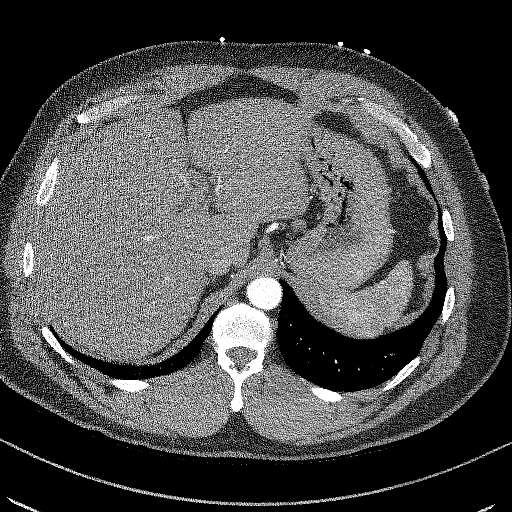
[im 28/136  soft-tissue]
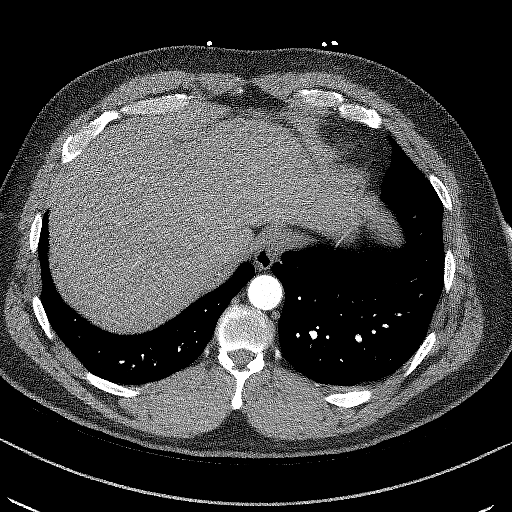
[im 41/136  soft-tissue]
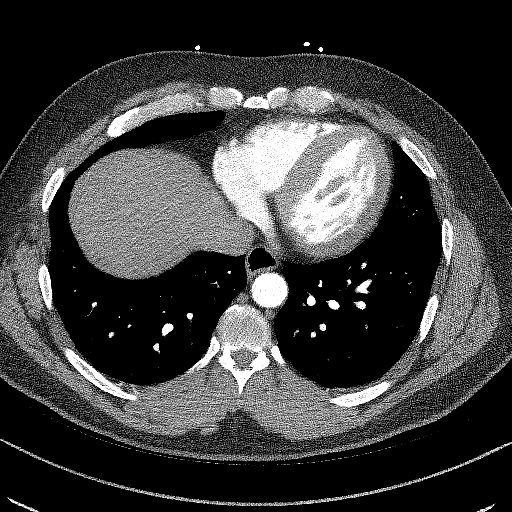
[im 55/136  soft-tissue]
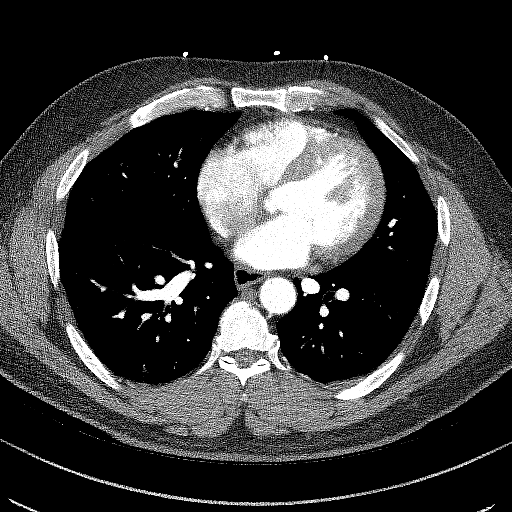
[im 82/136  soft-tissue]
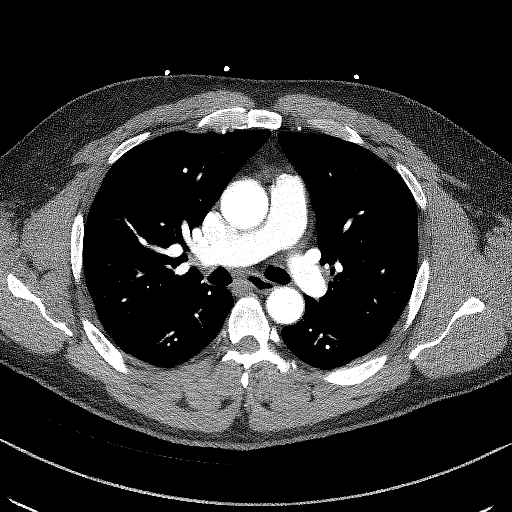
[im 95/136  soft-tissue]
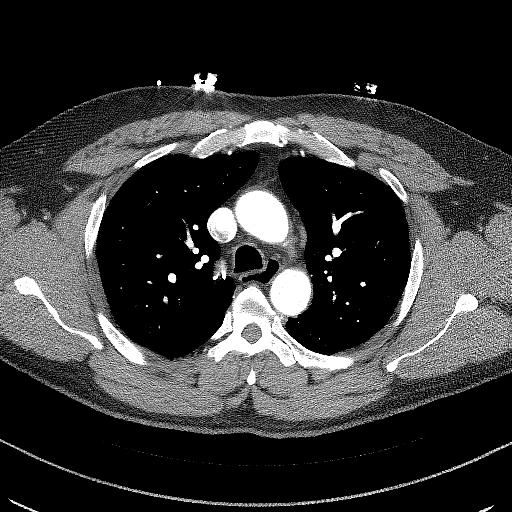

[Series 9: cor · coronal · 0.59mm/px · 3 of 151 slices shown]
[im 38/151  soft-tissue]
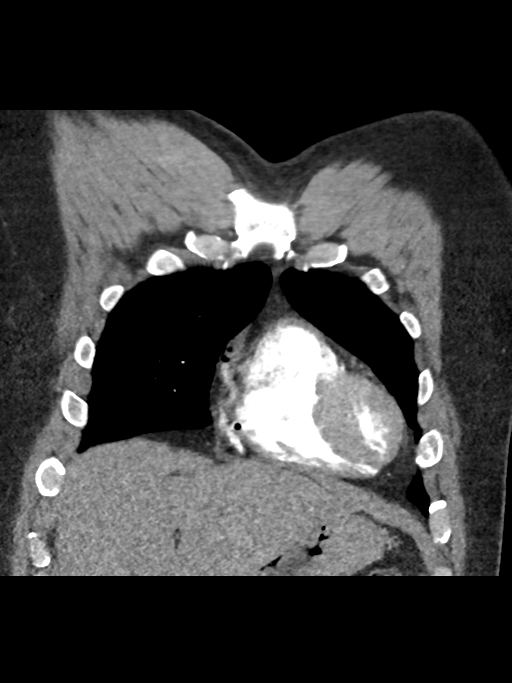
[im 76/151  soft-tissue]
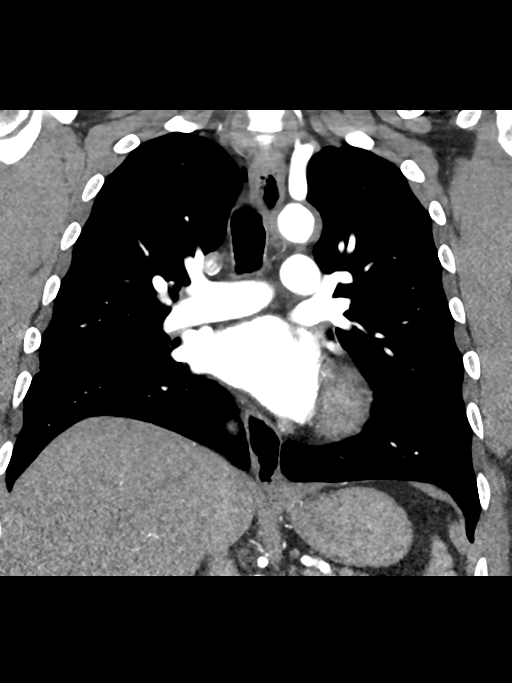
[im 113/151  soft-tissue]
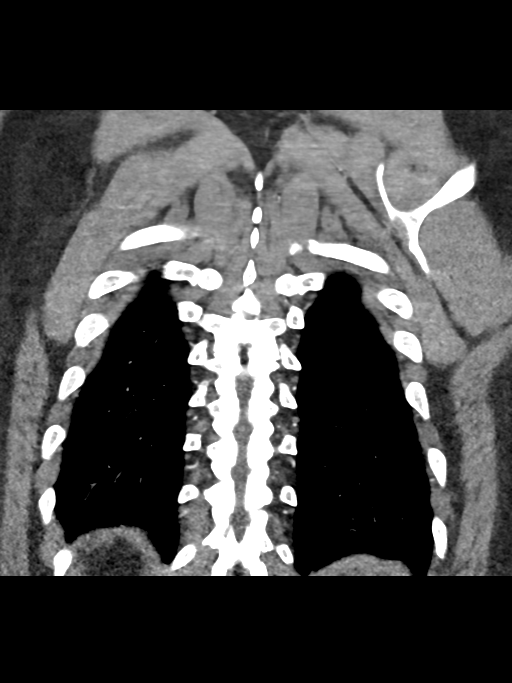

[18 of 46 positions shown; findings below may reference images not displayed]

RADIATION DOSE REDUCTION: This exam was performed according to the
departmental dose-optimization program which includes automated
exposure control, adjustment of the mA and/or kV according to
patient size and/or use of iterative reconstruction technique.

CONTRAST:  100mL OMNIPAQUE IOHEXOL 350 MG/ML SOLN
FINDINGS: Cardiovascular: Thoracic aorta is normal in caliber with mixed
plaque along the arch. No evidence of intramural hematoma or
dissection. There is no evidence of a central pulmonary embolism.
Heart size is normal. No pericardial effusion. Coronary artery
calcification.

Mediastinum/Nodes: No enlarged nodes.  Normal thyroid.

Lungs/Pleura: No consolidation or mass. No pleural effusion or
pneumothorax.

Upper Abdomen: No acute abnormality.

Musculoskeletal: No acute osseous abnormality.

Review of the MIP images confirms the above findings.
IMPRESSION: No evidence of aortic dissection or other acute abnormality.
